# Patient Record
Sex: Male | Born: 1995 | Race: White | Hispanic: No | Marital: Single | Smoking: Never smoker
Health system: Southern US, Community
[De-identification: ages and names within clinical notes are randomized; demographics above are authoritative.]

## PROBLEM LIST (undated history)

## (undated) DIAGNOSIS — C8350 Lymphoblastic (diffuse) lymphoma, unspecified site: Secondary | ICD-10-CM

## (undated) HISTORY — PX: LUMBAR PUNCTURE: SHX1985

## (undated) HISTORY — PX: LYMPH NODE DISSECTION: SHX5087

---

## 2017-12-19 ENCOUNTER — Encounter: Payer: Self-pay | Admitting: Emergency Medicine

## 2017-12-19 ENCOUNTER — Other Ambulatory Visit: Payer: Self-pay

## 2017-12-19 ENCOUNTER — Emergency Department
Admission: EM | Admit: 2017-12-19 | Discharge: 2017-12-19 | Disposition: A | Discharge status: Home / Self Care | Payer: BLUE CROSS/BLUE SHIELD | Source: Home / Self Care | Attending: Emergency Medicine | Admitting: Emergency Medicine

## 2017-12-19 ENCOUNTER — Emergency Department: Payer: BLUE CROSS/BLUE SHIELD

## 2017-12-19 ENCOUNTER — Observation Stay
Admission: EM | Admit: 2017-12-19 | Discharge: 2017-12-21 | Disposition: A | Discharge status: Home / Self Care | Payer: BLUE CROSS/BLUE SHIELD | Attending: Internal Medicine | Admitting: Internal Medicine

## 2017-12-19 DIAGNOSIS — C9150 Adult T-cell lymphoma/leukemia (HTLV-1-associated) not having achieved remission: Secondary | ICD-10-CM | POA: Insufficient documentation

## 2017-12-19 DIAGNOSIS — R5081 Fever presenting with conditions classified elsewhere: Secondary | ICD-10-CM | POA: Insufficient documentation

## 2017-12-19 DIAGNOSIS — R740 Nonspecific elevation of levels of transaminase and lactic acid dehydrogenase [LDH]: Secondary | ICD-10-CM | POA: Insufficient documentation

## 2017-12-19 DIAGNOSIS — E860 Dehydration: Secondary | ICD-10-CM | POA: Insufficient documentation

## 2017-12-19 DIAGNOSIS — D709 Neutropenia, unspecified: Secondary | ICD-10-CM

## 2017-12-19 DIAGNOSIS — Z881 Allergy status to other antibiotic agents status: Secondary | ICD-10-CM | POA: Diagnosis not present

## 2017-12-19 DIAGNOSIS — R739 Hyperglycemia, unspecified: Secondary | ICD-10-CM | POA: Diagnosis not present

## 2017-12-19 DIAGNOSIS — D696 Thrombocytopenia, unspecified: Secondary | ICD-10-CM

## 2017-12-19 DIAGNOSIS — R509 Fever, unspecified: Secondary | ICD-10-CM | POA: Diagnosis present

## 2017-12-19 HISTORY — DX: Lymphoblastic (diffuse) lymphoma, unspecified site: C83.50

## 2017-12-19 LAB — BLOOD CULTURE ID PANEL (REFLEXED)
Acinetobacter baumannii: NOT DETECTED
CANDIDA ALBICANS: NOT DETECTED
CANDIDA GLABRATA: NOT DETECTED
CANDIDA KRUSEI: NOT DETECTED
CANDIDA TROPICALIS: NOT DETECTED
Candida parapsilosis: NOT DETECTED
ENTEROBACTER CLOACAE COMPLEX: NOT DETECTED
ENTEROBACTERIACEAE SPECIES: NOT DETECTED
ESCHERICHIA COLI: NOT DETECTED
Enterococcus species: NOT DETECTED
Haemophilus influenzae: NOT DETECTED
KLEBSIELLA OXYTOCA: NOT DETECTED
KLEBSIELLA PNEUMONIAE: NOT DETECTED
Listeria monocytogenes: NOT DETECTED
Methicillin resistance: NOT DETECTED
NEISSERIA MENINGITIDIS: NOT DETECTED
Proteus species: NOT DETECTED
Pseudomonas aeruginosa: NOT DETECTED
STREPTOCOCCUS PYOGENES: NOT DETECTED
STREPTOCOCCUS SPECIES: NOT DETECTED
Serratia marcescens: NOT DETECTED
Staphylococcus aureus (BCID): NOT DETECTED
Staphylococcus species: DETECTED — AB
Streptococcus agalactiae: NOT DETECTED
Streptococcus pneumoniae: NOT DETECTED

## 2017-12-19 LAB — COMPREHENSIVE METABOLIC PANEL
ALBUMIN: 4 g/dL (ref 3.5–5.0)
ALK PHOS: 59 U/L (ref 38–126)
ALT: 117 U/L — ABNORMAL HIGH (ref 0–44)
AST: 31 U/L (ref 15–41)
Anion gap: 6 (ref 5–15)
BILIRUBIN TOTAL: 0.7 mg/dL (ref 0.3–1.2)
BUN: 18 mg/dL (ref 6–20)
CALCIUM: 8.6 mg/dL — AB (ref 8.9–10.3)
CO2: 31 mmol/L (ref 22–32)
Chloride: 98 mmol/L (ref 98–111)
Creatinine, Ser: 0.75 mg/dL (ref 0.61–1.24)
GFR calc Af Amer: >60 mL/min (ref >60)
GLUCOSE: 112 mg/dL — AB (ref 70–99)
POTASSIUM: 3.6 mmol/L (ref 3.5–5.1)
Sodium: 135 mmol/L (ref 135–145)
TOTAL PROTEIN: 6 g/dL — AB (ref 6.5–8.1)

## 2017-12-19 LAB — MAGNESIUM: Magnesium: 1.9 mg/dL (ref 1.7–2.4)

## 2017-12-19 LAB — CBC WITH DIFFERENTIAL/PLATELET
BAND NEUTROPHILS: 6 %
BASOS ABS: 0 10*3/uL (ref 0–0.1)
Basophils Relative: 0 %
Blasts: 0 %
EOS ABS: 0.1 10*3/uL (ref 0–0.7)
Eosinophils Relative: 9 %
HCT: 31.6 % — ABNORMAL LOW (ref 40.0–52.0)
HEMOGLOBIN: 11.2 g/dL — AB (ref 13.0–18.0)
LYMPHS PCT: 21 %
Lymphs Abs: 0.3 10*3/uL — ABNORMAL LOW (ref 1.0–3.6)
MCH: 33.5 pg (ref 26.0–34.0)
MCHC: 35.5 g/dL (ref 32.0–36.0)
MCV: 94.3 fL (ref 80.0–100.0)
METAMYELOCYTES PCT: 0 %
MONO ABS: 0.1 10*3/uL — AB (ref 0.2–1.0)
MYELOCYTES: 0 %
Monocytes Relative: 7 %
Neutro Abs: 0.9 10*3/uL — ABNORMAL LOW (ref 1.4–6.5)
Neutrophils Relative %: 50 %
Other: 7 %
PROMYELOCYTES RELATIVE: 0 %
Platelets: 19 10*3/uL — CL (ref 150–440)
RBC: 3.35 MIL/uL — ABNORMAL LOW (ref 4.40–5.90)
RDW: 15.2 % — ABNORMAL HIGH (ref 11.5–14.5)
WBC: 1.6 10*3/uL — ABNORMAL LOW (ref 3.8–10.6)
nRBC: 0 /100 WBC

## 2017-12-19 LAB — LACTATE DEHYDROGENASE: LDH: 124 U/L (ref 98–192)

## 2017-12-19 LAB — PROCALCITONIN

## 2017-12-19 LAB — LACTIC ACID, PLASMA: Lactic Acid, Venous: 1.3 mmol/L (ref 0.5–1.9)

## 2017-12-19 LAB — PHOSPHORUS: PHOSPHORUS: 4.4 mg/dL (ref 2.5–4.6)

## 2017-12-19 LAB — URIC ACID: URIC ACID, SERUM: 6.7 mg/dL (ref 3.7–8.6)

## 2017-12-19 MED ORDER — SODIUM CHLORIDE 0.9 % IV BOLUS: 1000.0000 mL | Freq: Once | INTRAVENOUS | Status: DC

## 2017-12-19 MED ORDER — IBUPROFEN 600 MG PO TABS
600.0000 mg | ORAL_TABLET | Freq: Once | ORAL | Status: AC
  Administered 2017-12-19: 600 mg via ORAL
  Filled 2017-12-19: qty 1

## 2017-12-19 MED ORDER — SODIUM CHLORIDE 0.9 % IV BOLUS
1000.0000 mL | Freq: Once | INTRAVENOUS | Status: AC
  Administered 2017-12-19: 1000 mL via INTRAVENOUS

## 2017-12-19 MED ORDER — ACETAMINOPHEN 500 MG PO TABS
1000.0000 mg | ORAL_TABLET | Freq: Once | ORAL | Status: AC
  Administered 2017-12-19: 1000 mg via ORAL
  Filled 2017-12-19: qty 2

## 2017-12-19 MED ORDER — SODIUM CHLORIDE 0.9 % IV SOLN: 2.0000 g | Freq: Three times a day (TID) | INTRAVENOUS | Status: DC

## 2017-12-19 MED ORDER — ACETAMINOPHEN 500 MG PO TABS: 1000.0000 mg | ORAL_TABLET | Freq: Once | ORAL | Status: DC

## 2017-12-19 MED ORDER — SODIUM CHLORIDE 0.9 % IV SOLN
1.0000 g | Freq: Once | INTRAVENOUS | Status: AC
  Administered 2017-12-19: 1 g via INTRAVENOUS
  Filled 2017-12-19: qty 10

## 2017-12-19 NOTE — ED Provider Notes (Addendum)
[facility] Emergency Department Provider Note  ____________________________________________   First MD Initiated Contact with Patient 12/19/17 [phone]     (approximate)  I have reviewed the triage vital signs and the nursing notes.   HISTORY  Chief Complaint Fever   HPI Paul Knox is a 22 y.o. male who self presents to the emergency department with fever to 102 degrees today along with headache and nausea.  The patient has a past medical history of stage IV CLL and is roughly 24 months into a 36-month treatment.  That he [place] his [place] out of [place] however has recently returned to [place] to continue college.  He had his last treatment 1 week ago.  At that time she was neutropenic so some of his chemotherapy was deferred.  He has not had a fever in some time and his oncologist advised him to come to the closest emergency department.  He denies cough or shortness of breath.  He denies leg swelling.  He denies dysuria.  He denies sore throat, rash, or ear pain.  His fever came on suddenly and nothing seems to make it better or worse.  He declines medication for headaches.    Past Medical History:  Diagnosis Date  . 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 Neutropenic fever ([place]) 12/19/2017    History reviewed. No pertinent surgical history.  Prior to Admission medications   Medication Sig Start Date End Date Taking? Authorizing Provider  amphetamine-dextroamphetamine (ADDERALL XR) 10 MG 24 hr capsule Take 1 capsule by mouth daily. 12/07/17  Yes [provider]  LORazepam (ATIVAN) 1 MG tablet Take 1 mg by mouth every 6 (six) hours as needed for anxiety.   Yes [provider]  mercaptopurine (PURINETHOL) 50 MG tablet Take 3 tablets by mouth daily. 12/07/17  Yes [provider]  methotrexate (RHEUMATREX) 2.5 MG tablet Take 14 tablets by mouth every 7 (seven) days. 12/07/17  Yes [provider]  ondansetron (ZOFRAN-ODT) 8 MG disintegrating tablet Take 8 mg by mouth every 8 (eight) hours as needed for nausea or vomiting.   Yes [provider]  ranitidine (ZANTAC) 150 MG tablet Take 1 tablet by mouth 2 (two) times daily. 12/14/17  Yes [provider]  dexamethasone (DECADRON) 1 MG tablet TAKE 6 TABS BY MOUTH TWICE A DAY FOR 5 DAYS 12/11/17   [provider]  sulfamethoxazole-trimethoprim (BACTRIM DS,SEPTRA DS) 800-160 MG tablet Take 1 tablet by mouth 2 (two) times daily. 12/07/17   [provider]    Allergies Ciprofloxacin  History reviewed. No pertinent family history.  Social History Social History   Tobacco Use  . Smoking status: Never Smoker  . Smokeless tobacco: Never Used  Substance Use Topics  . Alcohol use: Not on file  . Drug use: Not on file    Review of Systems Constitutional: Positive for fevers Eyes: No visual changes. ENT: No sore throat. Cardiovascular: Denies chest pain. Respiratory: Denies shortness of breath. Gastrointestinal: No abdominal pain.  Positive for nausea nausea, no vomiting.  No diarrhea.  No constipation. Genitourinary: Negative for dysuria. Musculoskeletal: Negative for back pain. Skin: Negative for rash. Neurological: Positive for headache  ____________________________________________   PHYSICAL EXAM:  VITAL SIGNS: ED Triage Vitals  Enc Vitals Group     BP 12/19/17 0224 118/64     Pulse Rate 12/19/17 0224 (!) 113     Resp 12/19/17 0224 19  Temp 12/19/17 0224 (!) 101.6 F (38.7 C)     Temp Source 12/19/17 0224 Oral     SpO2 12/19/17 0224 97 %     Weight 12/19/17 0222 162 lb (73.5 kg)     Height 12/19/17 0222 6\' 2"  (1.88 m)     Head Circumference --      Peak Flow --      Pain Score 12/19/17 0222 6     Pain Loc --      Pain Edu? --      Excl. in [place]? --     Constitutional: Alert and oriented x4 nontoxic mild diaphoresis although no respiratory distress Eyes: PERRL EOMI.  midrange and brisk Head: Atraumatic.  Normal tympanic membranes bilaterally Nose: No congestion/rhinnorhea. Mouth/Throat: No trismus uvula midline no pharyngeal erythema or exudate Neck: No stridor.   Cardiovascular: Tachycardic rate, regular rhythm. Grossly normal heart sounds.  Good peripheral circulation. Respiratory: Normal respiratory effort.  No retractions. Lungs CTAB and moving good air Gastrointestinal: Soft nontender Musculoskeletal: No lower extremity edema   Neurologic:  Normal speech and language. No gross focal neurologic deficits are appreciated. Skin:  Skin is warm, dry and intact. No rash noted. Psychiatric: Mood and affect are normal. Speech and behavior are normal.    ____________________________________________   DIFFERENTIAL includes but not limited to  Febrile neutropenia, bacteremia, sepsis, viral infection ____________________________________________   LABS (all labs ordered are listed, but only abnormal results are displayed)  Labs Reviewed  COMPREHENSIVE METABOLIC PANEL - Abnormal; Notable for the following components:      Result Value   Glucose, Bld 112 (*)    Calcium 8.6 (*)    Total Protein 6.0 (*)    ALT 117 (*)    All other components within normal limits  CBC WITH DIFFERENTIAL/PLATELET - Abnormal; Notable for the following components:   WBC 1.6 (*)    RBC 3.35 (*)    Hemoglobin 11.2 (*)    HCT 31.6 (*)    RDW 15.2 (*)    Platelets 19 (*)    Neutro Abs 0.9 (*)    Lymphs Abs 0.3 (*)    Monocytes Absolute 0.1 (*)    All other components within normal limits  CULTURE, BLOOD (ROUTINE X 2)  CULTURE, BLOOD (ROUTINE X 2)  RESPIRATORY PANEL BY PCR  PROCALCITONIN  LACTIC ACID, PLASMA  LACTATE DEHYDROGENASE  MAGNESIUM  PHOSPHORUS  URIC ACID  LACTIC ACID, PLASMA  URINALYSIS, COMPLETE (UACMP) WITH MICROSCOPIC  FUNGITELL, SERUM    Lab work reviewed by me shows ANC of 800.  Significantly thrombocytopenic although according to his oncologist  this is at his baseline __________________________________________  EKG   ____________________________________________  RADIOLOGY  Chest x-ray reviewed by me with no acute disease noted ____________________________________________   PROCEDURES  Procedure(s) performed: no  .Critical Care Performed by: Darel Hong, MD Authorized by: Darel Hong, MD   Critical care provider statement:    Critical care time (minutes):  30   Critical care was necessary to treat or prevent imminent or life-threatening deterioration of the following conditions:  Sepsis   Critical care was time spent personally by me on the following activities:  Discussions with consultants, evaluation of patient's response to treatment, examination of patient, ordering and performing treatments and interventions, ordering and review of laboratory studies, ordering and review of radiographic studies, pulse oximetry, re-evaluation of patient's condition, obtaining history from patient or surrogate and review of old charts    Critical Care performed: Yes  ____________________________________________  INITIAL IMPRESSION / ASSESSMENT AND PLAN / ED COURSE  Pertinent labs & imaging results that were available during my care of the patient were reviewed by me and considered in my medical decision making (see chart for details).   As part of my medical decision making, I reviewed the following data within the [place] History obtained from family if available, nursing notes, old chart and ekg, as well as notes from prior ED visits.  The patient is very well informed about his disease and has asked me to reach out to his oncologist to discuss.  We will begin with labs and cultures now.     ----------------------------------------- 2:57 AM on 12/19/2017 ----------------------------------------- I spoke with the patient's oncologist Dr. Luetta Nutting who recommends ceftriaxone for now, not cefepime  and not vancomycin.  She is asking me to call her back with the results and we can discuss.  ____________________________________________ ----------------------------------------- 4:36 AM on 12/19/2017 -----------------------------------------  I spoke with the patient's oncologist Dr. Elta Guadeloupe [phone] who feels it is actually appropriate for the patient to be discharged home today with no more antibiotics.  She will personally call him later today to follow-up and ensure that he is improving.  I have reviewed all of his lab work with her and she has recommended no further antibiotics as the ceftriaxone will cover him for 24 hours.  The patient also gave me permission to discuss with his father who is out of state and is a Education officer, environmental and once again we went over all the patient's blood work and agree with the outpatient plan.  The patient understands he is more than welcome to return to our hospital at any point and we are happy to continue his care.  FINAL CLINICAL IMPRESSION(S) / ED DIAGNOSES  Final diagnoses:  Neutropenic fever ([place])  Thrombocytopenia ([place])      NEW MEDICATIONS STARTED DURING THIS VISIT:  New Prescriptions   No medications on file     Note:  This document was prepared using Dragon voice recognition software and may include unintentional dictation errors.     Darel Hong, MD 12/19/17 8295    Darel Hong, MD 12/19/17 [phone]

## 2017-12-19 NOTE — ED Triage Notes (Signed)
Patient ambulatory to triage with steady gait, without difficulty or distress noted; pt here last night for fever, currently receiving chemo; returns tonight; st was called back for a positive blood culture

## 2017-12-19 NOTE — ED Notes (Signed)
Pt refusal to wear 5 leads, pulse ox, bp cuff. Requesting d/c. EDP aware.

## 2017-12-19 NOTE — ED Triage Notes (Addendum)
Patient ambulatory to triage with steady gait, without difficulty or distress noted; pt currently receiving chemo; fever 100.5-102 today accomp by nausea and HA to temples; mask placed on pt

## 2017-12-19 NOTE — ED Provider Notes (Addendum)
Patient's blood culture returned with positive staph infection  I have called and spoke with the patient himself via the phone.  He reports he is feeling all right at the moment, discussed his culture and he will be returning to the ER.  Patient understands he is showing evidence of a bloodstream infection that needs IV antibiotics and evaluation at the hospital tonight/as soon as possible this evaning.  Patient reports that he will be coming over to the ER shortly this evening for further reevaluation.  I did convey the urgency of needing to return to the ER, patient reports that at the moment he is doing well and will be able to be over here shortly this evening.   Delman Kitten, MD 12/19/17 2054   Father advises patient has chronic R great toe infection is a possible source and also had a recent fall with some abrasions.   Father advises son will be coming to [facility] ER for evaluation.     Delman Kitten, MD 12/19/17 2157

## 2017-12-19 NOTE — ED Notes (Signed)
Pt refused to be admitted to hospital. Dr. Mable Paris worked out an out of hospital care plan with his PCP and is discharging pt.

## 2017-12-19 NOTE — ED Notes (Signed)
Lab called with positive blood culture of staphyloccus  Dr Jacqualine Code aware.

## 2017-12-19 NOTE — Progress Notes (Signed)
CODE SEPSIS - PHARMACY COMMUNICATION  **Broad Spectrum Antibiotics should be administered within 1 hour of Sepsis diagnosis**  Time Code Sepsis Called/Page Received: 2902 1115  Antibiotics Ordered: 5208 0223  Time of 1st antibiotic administration: 3612 2449  Additional action taken by pharmacy:   If necessary, Name of Provider/Nurse Contacted:     Eloise Harman ,PharmD Clinical Pharmacist  12/19/2017  3:49 AM

## 2017-12-19 NOTE — Discharge Instructions (Addendum)
Please make sure you keep your phone on at all times today because Dr. Elta Guadeloupe will be calling you in a few hours for a recheck.  Please to return to the emergency department at any point for worsening pain, worsening fevers, if you cannot eat or drink, or for any other issues whatsoever.  It was a pleasure to take care of you today, and thank you for coming to our emergency department.  If you have any questions or concerns before leaving please ask the nurse to grab me and I'm more than happy to go through your aftercare instructions again.  If you were prescribed any opioid pain medication today such as Norco, Vicodin, Percocet, morphine, hydrocodone, or oxycodone please make sure you do not drive when you are taking this medication as it can alter your ability to drive safely.  If you have any concerns once you are home that you are not improving or are in fact getting worse before you can make it to your follow-up appointment, please do not hesitate to call 911 and come back for further evaluation.  Darel Hong, MD  Results for orders placed or performed during the hospital encounter of 12/19/17  Comprehensive metabolic panel  Result Value Ref Range   Sodium 135 135 - 145 mmol/L   Potassium 3.6 3.5 - 5.1 mmol/L   Chloride 98 98 - 111 mmol/L   CO2 31 22 - 32 mmol/L   Glucose, Bld 112 (H) 70 - 99 mg/dL   BUN 18 6 - 20 mg/dL   Creatinine, Ser 0.75 0.61 - 1.24 mg/dL   Calcium 8.6 (L) 8.9 - 10.3 mg/dL   Total Protein 6.0 (L) 6.5 - 8.1 g/dL   Albumin 4.0 3.5 - 5.0 g/dL   AST 31 15 - 41 U/L   ALT 117 (H) 0 - 44 U/L   Alkaline Phosphatase 59 38 - 126 U/L   Total Bilirubin 0.7 0.3 - 1.2 mg/dL   GFR calc non Af Amer >60 >60 mL/min   GFR calc Af Amer >60 >60 mL/min   Anion gap 6 5 - 15  Procalcitonin  Result Value Ref Range   Procalcitonin <0.10 ng/mL  CBC WITH DIFFERENTIAL  Result Value Ref Range   WBC PENDING 4.0 - 10.5 K/uL   RBC 3.35 (L) 4.40 - 5.90 MIL/uL   Hemoglobin 11.2 (L)  13.0 - 18.0 g/dL   HCT 31.6 (L) 40.0 - 52.0 %   MCV 94.3 80.0 - 100.0 fL   MCH 33.5 26.0 - 34.0 pg   MCHC 35.5 32.0 - 36.0 g/dL   RDW 15.2 (H) 11.5 - 14.5 %   Platelets 19 (LL) 150 - 440 K/uL   Neutrophils Relative % PENDING %   Neutro Abs PENDING 1.7 - 7.7 K/uL   Band Neutrophils PENDING %   Lymphocytes Relative PENDING %   Lymphs Abs PENDING 0.7 - 4.0 K/uL   Monocytes Relative PENDING %   Monocytes Absolute PENDING 0.1 - 1.0 K/uL   Eosinophils Relative PENDING %   Eosinophils Absolute PENDING 0.0 - 0.7 K/uL   Basophils Relative PENDING %   Basophils Absolute PENDING 0.0 - 0.1 K/uL   WBC Morphology PENDING    RBC Morphology PENDING    Smear Review PENDING    Other PENDING %   nRBC PENDING 0 /100 WBC   Metamyelocytes Relative PENDING %   Myelocytes PENDING %   Promyelocytes Relative PENDING %   Blasts PENDING %  Lactic acid, plasma  Result  Value Ref Range   Lactic Acid, Venous 1.3 0.5 - 1.9 mmol/L  Lactate dehydrogenase  Result Value Ref Range   LDH 124 98 - 192 U/L  Magnesium  Result Value Ref Range   Magnesium 1.9 1.7 - 2.4 mg/dL  Phosphorus  Result Value Ref Range   Phosphorus 4.4 2.5 - 4.6 mg/dL  Uric acid  Result Value Ref Range   Uric Acid, Serum 6.7 3.7 - 8.6 mg/dL   Dg Chest Port 1 View  Result Date: 12/19/2017 CLINICAL DATA:  Febrile neutropenia EXAM: PORTABLE CHEST 1 VIEW COMPARISON:  None. FINDINGS: The heart size and mediastinal contours are within normal limits. Both lungs are clear. The visualized skeletal structures are unremarkable. IMPRESSION: No active disease. Electronically Signed   By: Ashley Royalty M.D.   On: 12/19/2017 03:50

## 2017-12-20 ENCOUNTER — Other Ambulatory Visit: Payer: Self-pay

## 2017-12-20 ENCOUNTER — Emergency Department: Payer: BLUE CROSS/BLUE SHIELD

## 2017-12-20 ENCOUNTER — Encounter: Payer: Self-pay | Admitting: Internal Medicine

## 2017-12-20 DIAGNOSIS — D709 Neutropenia, unspecified: Principal | ICD-10-CM

## 2017-12-20 DIAGNOSIS — R5081 Fever presenting with conditions classified elsewhere: Secondary | ICD-10-CM | POA: Diagnosis not present

## 2017-12-20 DIAGNOSIS — C91Z0 Other lymphoid leukemia not having achieved remission: Secondary | ICD-10-CM

## 2017-12-20 LAB — CBC WITH DIFFERENTIAL/PLATELET
BASOS ABS: 0 10*3/uL (ref 0–0.1)
BASOS PCT: 0 %
Band Neutrophils: 1 %
Blasts: 0 %
EOS PCT: 5 %
Eosinophils Absolute: 0.1 10*3/uL (ref 0–0.7)
HCT: 33.8 % — ABNORMAL LOW (ref 40.0–52.0)
Hemoglobin: 12.1 g/dL — ABNORMAL LOW (ref 13.0–18.0)
LYMPHS ABS: 0.6 10*3/uL — AB (ref 1.0–3.6)
Lymphocytes Relative: 32 %
MCH: 33.6 pg (ref 26.0–34.0)
MCHC: 35.9 g/dL (ref 32.0–36.0)
MCV: 93.7 fL (ref 80.0–100.0)
METAMYELOCYTES PCT: 1 %
MONO ABS: 0.2 10*3/uL (ref 0.2–1.0)
MONOS PCT: 13 %
MYELOCYTES: 0 %
NEUTROS ABS: 0.8 10*3/uL — AB (ref 1.4–6.5)
Neutrophils Relative %: 42 %
Other: 6 %
PLATELETS: 20 10*3/uL — AB (ref 150–440)
Promyelocytes Relative: 0 %
RBC: 3.6 MIL/uL — AB (ref 4.40–5.90)
RDW: 14.7 % — AB (ref 11.5–14.5)
WBC: 1.9 10*3/uL — AB (ref 3.8–10.6)
nRBC: 1 /100 WBC — ABNORMAL HIGH

## 2017-12-20 LAB — URINALYSIS, ROUTINE W REFLEX MICROSCOPIC
Bilirubin Urine: NEGATIVE
Glucose, UA: NEGATIVE mg/dL
Hgb urine dipstick: NEGATIVE
KETONES UR: NEGATIVE mg/dL
Leukocytes, UA: NEGATIVE
Nitrite: NEGATIVE
PROTEIN: NEGATIVE mg/dL
Specific Gravity, Urine: 1.008 (ref 1.005–1.030)
pH: 8 (ref 5.0–8.0)

## 2017-12-20 LAB — RESPIRATORY PANEL BY PCR
ADENOVIRUS-RVPPCR: NOT DETECTED
Bordetella pertussis: NOT DETECTED
CORONAVIRUS HKU1-RVPPCR: NOT DETECTED
CORONAVIRUS NL63-RVPPCR: NOT DETECTED
CORONAVIRUS OC43-RVPPCR: NOT DETECTED
Chlamydophila pneumoniae: NOT DETECTED
Coronavirus 229E: NOT DETECTED
Influenza A: NOT DETECTED
Influenza B: NOT DETECTED
MYCOPLASMA PNEUMONIAE-RVPPCR: NOT DETECTED
Metapneumovirus: NOT DETECTED
PARAINFLUENZA VIRUS 1-RVPPCR: NOT DETECTED
PARAINFLUENZA VIRUS 3-RVPPCR: NOT DETECTED
PARAINFLUENZA VIRUS 4-RVPPCR: NOT DETECTED
Parainfluenza Virus 2: NOT DETECTED
Respiratory Syncytial Virus: NOT DETECTED
Rhinovirus / Enterovirus: DETECTED — AB

## 2017-12-20 LAB — COMPREHENSIVE METABOLIC PANEL
ALBUMIN: 4.1 g/dL (ref 3.5–5.0)
ALT: 101 U/L — ABNORMAL HIGH (ref 0–44)
AST: 24 U/L (ref 15–41)
Alkaline Phosphatase: 64 U/L (ref 38–126)
Anion gap: 9 (ref 5–15)
BUN: 16 mg/dL (ref 6–20)
CHLORIDE: 98 mmol/L (ref 98–111)
CO2: 28 mmol/L (ref 22–32)
Calcium: 8.4 mg/dL — ABNORMAL LOW (ref 8.9–10.3)
Creatinine, Ser: 0.7 mg/dL (ref 0.61–1.24)
GFR calc Af Amer: >60 mL/min (ref >60)
GFR calc non Af Amer: >60 mL/min (ref >60)
GLUCOSE: 109 mg/dL — AB (ref 70–99)
Potassium: 3.8 mmol/L (ref 3.5–5.1)
SODIUM: 135 mmol/L (ref 135–145)
Total Bilirubin: 0.9 mg/dL (ref 0.3–1.2)
Total Protein: 6.2 g/dL — ABNORMAL LOW (ref 6.5–8.1)

## 2017-12-20 LAB — CBC
HEMATOCRIT: 30.3 % — AB (ref 40.0–52.0)
Hemoglobin: 11 g/dL — ABNORMAL LOW (ref 13.0–18.0)
MCH: 33.7 pg (ref 26.0–34.0)
MCHC: 36.1 g/dL — AB (ref 32.0–36.0)
MCV: 93.4 fL (ref 80.0–100.0)
Platelets: 16 10*3/uL — CL (ref 150–440)
RBC: 3.25 MIL/uL — ABNORMAL LOW (ref 4.40–5.90)
RDW: 14.8 % — AB (ref 11.5–14.5)
WBC: 1.8 10*3/uL — AB (ref 3.8–10.6)

## 2017-12-20 LAB — BASIC METABOLIC PANEL
Anion gap: 5 (ref 5–15)
BUN: 12 mg/dL (ref 6–20)
CO2: 29 mmol/L (ref 22–32)
Calcium: 8.3 mg/dL — ABNORMAL LOW (ref 8.9–10.3)
Chloride: 104 mmol/L (ref 98–111)
Creatinine, Ser: 0.6 mg/dL — ABNORMAL LOW (ref 0.61–1.24)
GFR calc Af Amer: >60 mL/min (ref >60)
GFR calc non Af Amer: >60 mL/min (ref >60)
GLUCOSE: 102 mg/dL — AB (ref 70–99)
POTASSIUM: 4.1 mmol/L (ref 3.5–5.1)
Sodium: 138 mmol/L (ref 135–145)

## 2017-12-20 LAB — LACTIC ACID, PLASMA: LACTIC ACID, VENOUS: 1 mmol/L (ref 0.5–1.9)

## 2017-12-20 MED ORDER — LACTATED RINGERS IV SOLN
INTRAVENOUS | Status: DC
  Administered 2017-12-20 – 2017-12-21 (×3): via INTRAVENOUS

## 2017-12-20 MED ORDER — ONDANSETRON HCL 4 MG PO TABS: 4.0000 mg | ORAL_TABLET | Freq: Four times a day (QID) | ORAL | Status: DC | PRN

## 2017-12-20 MED ORDER — SODIUM CHLORIDE 0.9 % IV BOLUS
1000.0000 mL | Freq: Once | INTRAVENOUS | Status: AC
  Administered 2017-12-20: 1000 mL via INTRAVENOUS

## 2017-12-20 MED ORDER — VANCOMYCIN HCL IN DEXTROSE 1-5 GM/200ML-% IV SOLN: 1000.0000 mg | Freq: Two times a day (BID) | INTRAVENOUS | Status: DC

## 2017-12-20 MED ORDER — VANCOMYCIN HCL IN DEXTROSE 1-5 GM/200ML-% IV SOLN
INTRAVENOUS | Status: AC
  Administered 2017-12-20: 1000 mg via INTRAVENOUS
  Filled 2017-12-20: qty 200

## 2017-12-20 MED ORDER — FAMOTIDINE 20 MG PO TABS: 20.0000 mg | ORAL_TABLET | Freq: Two times a day (BID) | ORAL | Status: DC | PRN

## 2017-12-20 MED ORDER — ONDANSETRON HCL 4 MG/2ML IJ SOLN: 4.0000 mg | Freq: Four times a day (QID) | INTRAMUSCULAR | Status: DC | PRN

## 2017-12-20 MED ORDER — SENNOSIDES-DOCUSATE SODIUM 8.6-50 MG PO TABS: 1.0000 | ORAL_TABLET | Freq: Every evening | ORAL | Status: DC | PRN

## 2017-12-20 MED ORDER — SODIUM CHLORIDE 0.9 % IV SOLN
1.0000 g | Freq: Once | INTRAVENOUS | Status: AC
  Administered 2017-12-20: 1 g via INTRAVENOUS
  Filled 2017-12-20: qty 10

## 2017-12-20 MED ORDER — BISACODYL 5 MG PO TBEC: 5.0000 mg | DELAYED_RELEASE_TABLET | Freq: Every day | ORAL | Status: DC | PRN

## 2017-12-20 MED ORDER — PIPERACILLIN-TAZOBACTAM 3.375 G IVPB
3.3750 g | Freq: Three times a day (TID) | INTRAVENOUS | Status: DC
  Administered 2017-12-20 – 2017-12-21 (×4): 3.375 g via INTRAVENOUS
  Filled 2017-12-20 (×3): qty 50

## 2017-12-20 MED ORDER — ACETAMINOPHEN 325 MG PO TABS
650.0000 mg | ORAL_TABLET | Freq: Four times a day (QID) | ORAL | Status: DC | PRN
  Administered 2017-12-20 (×2): 650 mg via ORAL
  Filled 2017-12-20 (×2): qty 2

## 2017-12-20 MED ORDER — VANCOMYCIN HCL IN DEXTROSE 1-5 GM/200ML-% IV SOLN
1000.0000 mg | Freq: Once | INTRAVENOUS | Status: AC
  Administered 2017-12-20: 1000 mg via INTRAVENOUS

## 2017-12-20 MED ORDER — ACETAMINOPHEN 650 MG RE SUPP: 650.0000 mg | Freq: Four times a day (QID) | RECTAL | Status: DC | PRN

## 2017-12-20 MED ORDER — VANCOMYCIN HCL IN DEXTROSE 1-5 GM/200ML-% IV SOLN
1000.0000 mg | Freq: Three times a day (TID) | INTRAVENOUS | Status: DC
  Administered 2017-12-20: 1000 mg via INTRAVENOUS
  Filled 2017-12-20 (×3): qty 200

## 2017-12-20 MED ORDER — LORAZEPAM 1 MG PO TABS
1.0000 mg | ORAL_TABLET | Freq: Four times a day (QID) | ORAL | Status: DC | PRN
  Administered 2017-12-20 (×3): 1 mg via ORAL
  Filled 2017-12-20 (×3): qty 1

## 2017-12-20 MED ORDER — PIPERACILLIN-TAZOBACTAM 3.375 G IVPB
INTRAVENOUS | Status: AC
  Filled 2017-12-20: qty 50

## 2017-12-20 NOTE — Consult Note (Signed)
[facility] CONSULT NOTE  Patient Care Team: System, Pcp Not In as PCP - General  CHIEF COMPLAINTS/PURPOSE OF CONSULTATION:  Neutropenic fever  HISTORY OF PRESENTING ILLNESS:  Paul Knox 22 y.o.  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  Patient states that he also diagnosed with T-cell lymphoma/leukemia approximately 2 and half years ago for which has been treated at [facility].  Patient is currently on maintenance therapy with vincristine/6-MP; and approximately every 3 months LP/intrathecal chemotherapy.  Patient states that his chemotherapy drugs titrated based upon his blood counts.  As per the patient he has been doing well from the lymphoma/leukemia standpoint.  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  Usually has his blood counts checked on every two-week basis.  ROS: A complete 10 point review of system is done which is negative except mentioned above in history of present illness  MEDICAL HISTORY:  Past Medical History:  Diagnosis Date  . Lymphoblastic lymphoma ([place])     SURGICAL HISTORY: History reviewed. No pertinent surgical history.  SOCIAL HISTORY: Social History   Socioeconomic History  . Marital status: Single    Spouse name: Not on file  . Number of children: Not on file  . Years of education: Not on file  . Highest education level: Not on file  Occupational History  . Not on file  Social Needs  . Financial resource strain: Not on file  . Food insecurity:    Worry: Not on file    Inability: Not on file  . Transportation needs:    Medical: Not on file    Non-medical: Not on file  Tobacco Use  . Smoking status: Never Smoker  . Smokeless tobacco: Never Used  Substance and Sexual Activity  . Alcohol use: Not on file  . Drug use: Not on file  . Sexual activity: Not on file   Lifestyle  . Physical activity:    Days per week: Not on file    Minutes per session: Not on file  . Stress: Not on file  Relationships  . Social connections:    Talks on phone: Not on file    Gets together: Not on file    Attends religious service: Not on file    Active member of club or organization: Not on file    Attends meetings of clubs or organizations: Not on file    Relationship status: Not on file  . Intimate partner violence:    Fear of current or ex partner: Not on file    Emotionally abused: Not on file    Physically abused: Not on file    Forced sexual activity: Not on file  Other Topics Concern  . Not on file  Social History Narrative  . Not on file    FAMILY HISTORY: History reviewed. No pertinent family history.  ALLERGIES:  is allergic to ciprofloxacin.  MEDICATIONS:  Current Facility-Administered Medications  Medication Dose Route Frequency Provider Last Rate Last Dose  . acetaminophen (TYLENOL) tablet 650 mg  650 mg Oral Q6H PRN Arta Silence, MD   650 mg at 12/20/17 0407   Or  . acetaminophen (TYLENOL) suppository 650 mg  650 mg Rectal Q6H PRN Arta Silence, MD      . bisacodyl (DULCOLAX) EC tablet 5 mg  5 mg Oral Daily PRN Arta Silence, MD      .  famotidine (PEPCID) tablet 20 mg  20 mg Oral BID PRN Arta Silence, MD      . lactated ringers infusion   Intravenous Continuous Arta Silence, MD 75 mL/hr at 12/20/17 1656    . LORazepam (ATIVAN) tablet 1 mg  1 mg Oral Q6H PRN Arta Silence, MD   1 mg at 12/20/17 1119  . ondansetron (ZOFRAN) tablet 4 mg  4 mg Oral Q6H PRN Arta Silence, MD       Or  . ondansetron (ZOFRAN) injection 4 mg  4 mg Intravenous Q6H PRN Arta Silence, MD      . piperacillin-tazobactam (ZOSYN) IVPB 3.375 g  3.375 g Intravenous Q8H Arta Silence, MD 12.5 mL/hr at 12/20/17 1841 3.375 g at 12/20/17 1841  . senna-docusate (Senokot-S) tablet 1 tablet  1 tablet Oral QHS PRN Arta Silence, MD          .  PHYSICAL EXAMINATION:  Vitals:   12/20/17 0747 12/20/17 1045  BP: (!) 113/46 123/68  Pulse: 66 87  Resp:    Temp: 98.4 F (36.9 C) 98.6 F (37 C)  SpO2: 98% 100%   Filed Weights   12/19/17 2351 12/20/17 0336  Weight: 162 lb 0.6 oz (73.5 kg) 169 lb 9.6 oz (76.9 kg)    GENERAL: Well-nourished well-developed; Alert, no distress and comfortable.   Nontoxic. EYES: no pallor or icterus OROPHARYNX: no thrush or ulceration. NECK: supple, no masses felt LYMPH:  no palpable lymphadenopathy in the cervical, axillary or inguinal regions LUNGS: Normal breath sounds to auscultation at bases and  No wheeze or crackles HEART/CVS: regular rate & rhythm and no murmurs; No lower extremity edema ABDOMEN: abdomen soft, non-tender and normal bowel sounds Musculoskeletal:no cyanosis of digits and no clubbing  PSYCH: alert & oriented x 3 with fluent speech NEURO: no focal motor/sensory deficits SKIN:  no rashes or significant lesions  LABORATORY DATA:  I have reviewed the data as listed Lab Results  Component Value Date   WBC 1.8 (L) 12/20/2017   HGB 11.0 (L) 12/20/2017   HCT 30.3 (L) 12/20/2017   MCV 93.4 12/20/2017   PLT 16 (LL) 12/20/2017   Recent Labs    12/19/17 0302 12/20/17 0009 12/20/17 0758  NA 135 135 138  K 3.6 3.8 4.1  CL 98 98 104  CO2 31 28 29   GLUCOSE 112* 109* 102*  BUN 18 16 12   CREATININE 0.75 0.70 0.60*  CALCIUM 8.6* 8.4* 8.3*  GFRNONAA >60 >60 >60  GFRAA >60 >60 >60  PROT 6.0* 6.2*  --   ALBUMIN 4.0 4.1  --   AST 31 24  --   ALT 117* 101*  --   ALKPHOS 59 64  --   BILITOT 0.7 0.9  --     RADIOGRAPHIC STUDIES: I have personally reviewed the radiological images as listed and agreed with the findings in the report. Dg Chest Port 1 View  Result Date: 12/19/2017 CLINICAL DATA:  Febrile neutropenia EXAM: PORTABLE CHEST 1 VIEW COMPARISON:  None. FINDINGS: The heart size and mediastinal contours are within normal limits. Both lungs  are clear. The visualized skeletal structures are unremarkable. IMPRESSION: No active disease. Electronically Signed   By: Ashley Royalty M.D.   On: 12/19/2017 03:50   Dg Toe Great Right  Result Date: 12/20/2017 CLINICAL DATA:  Positive blood cultures with right great toe pain. Evaluate for osteomyelitis. EXAM: RIGHT GREAT TOE COMPARISON:  None. FINDINGS: No cortical bone destruction or fracture. No joint dislocation of the right  great toe. No marginal nor extra articular erosions. No periostitis. No soft tissue mass, emphysema or significant soft tissue swelling. IMPRESSION: Negative for acute fracture, joint dislocation or bone destruction. Electronically Signed   By: Ashley Royalty M.D.   On: 12/20/2017 00:33    ASSESSMENT & PLAN:   #22 year old male patient with T-cell lymphoma/leukemia currently to the hospital for neutropenic fever.  #Neutropenic fever-unclear etiology blood cultures pending. chest x-ray urinalysis negative.  Hold off Granix as patient does not appear toxic.  Currently on broad-spectrum antibiotics with vancomycin and Zosyn.  Await cultures; patient will be discharged home on Levaquin for total of 7 days.   #Acute T cell lymphoma/leukemia-currently on maintenance therapy; as per patient clinical remission.  He is to finish his maintenance therapy July 2020.   Disposition : patient feeling significantly better since admit to the hospital.  He is interested in being discharged in the morning tomorrow as he plans to class the morning.   # Patient has my information; he was encouraged to call us if any questions or concerns with regards to any acute issues while he is in town.  Thank you Dr.Modi for allowing me to participate in the care of your pleasant patient. Please do not hesitate to contact me with questions or concerns in the interim.  All questions were answered. The patient knows to call the clinic with any problems, questions or concerns.    Cammie Sickle,  MD 12/20/2017 8:10 PM

## 2017-12-20 NOTE — ED Notes (Signed)
Paient transported to room 118 by this EDT.

## 2017-12-20 NOTE — H&P (Addendum)
[place] [facility] [place] NAME: Paul Knox    MR#:  [id number]  DATE OF BIRTH:  1995/12/19  DATE OF ADMISSION:  12/19/2017  PRIMARY CARE PHYSICIAN: System, Pcp Not In   REQUESTING/REFERRING PHYSICIAN: Merlyn Lot, MD  CHIEF COMPLAINT:   Chief Complaint  Patient presents with  . Fever    HISTORY OF PRESENT ILLNESS:  Paul Knox  is a 22 y.o. male with a known history of T-cell lymphoblastic lymphoma who p/w neutropenic fever. Pt was at [facility] ED on the evening of Monday 12/18/2017 w/ neutropenic fever. The case was discussed w/ pt's Oncology group, who recommended a single dose of IV Ceftriaxone, and D/C home. Pt was agreeable to this, and went home. He now returns w/ continued fevers and a (+) blood culture result.  Paul Knox states that he receives care at [facility] in [place], [place]. He states his primary physician is Dr. Corinna Capra. He states he presently attends [facility], but one Monday per month, he drives back to [place] and manages his medical care. He states that he is in the maintenance phase of therapy for his T-cell lymphoma. My understanding is that he receives intrathecal Vincristine once per month, which I believe he received last Monday (09/09), and takes 6-MP, Methotrexate and Decadron as maintenance therapy. He states that the medications are generally held when his [place] is in the triple digits, and goal ANC is 1200-1800. It has been held recently for this reason. He states he typically has labwork drawn q2wks, but when hematological indices are abnormal, he has bloodwork performed q1wk instead (which he states is presently the case). He is already scheduled for labwork this coming Monday (09/23). We had a long conversation regarding fragmentation of care. He tells me that he has not had any significant issues necessitating hospitalization in the local area up until  yesterday.  Pt states that prior to coming to the ED on 09/16, he had bene experiencing fever, chills, diaphoresis and lightheadedness. He endorses an episode of loose stools, which he attributes to a dietary change, but denies frank diarrhea or N/V/AP. He states he came to the ED for evaluation, received a dose of IV Ceftriaxone and IVF. He states he felt much better, and went home. He continued to feel better at home. He states, however, that he did continue to have low-grade fevers. He endorses dehydration, but has otherwise not had any new symptoms. He states he slept for much of the day. Later in the day, he received a phone call from the lab stating that a blood culture result was positive, and that he should return to the ED. Culture result reads, "Methicillin (oxacillin) susceptible coagulase negative staphylococcus. Possible blood culture contaminant." Lactate 1.0. PCT yesterday was < 0.10. Bloodwork demonstrates pancytopenia, stable. Pt appears well, and does not appear toxic. He feels well. He denies cough/SOB, N/V/D/AP, urinary symptoms. He notes a past Hx of R great toe ingrown nail w/ infxn. The toe does not appear infected, and an XR is (-) for osteomyelitis. Pt states he hopes to go home later in the day on Wednesday 09/18.  PAST MEDICAL HISTORY:   Past Medical History:  Diagnosis Date  . Lymphoblastic lymphoma ([place])     PAST SURGICAL HISTORY:  History reviewed. No pertinent surgical history.  SOCIAL HISTORY:   Social History   Tobacco Use  . Smoking status: Never Smoker  . Smokeless tobacco: Never  Used  Substance Use Topics  . Alcohol use: Not on file    FAMILY HISTORY:  History reviewed. No pertinent family history.  DRUG ALLERGIES:   Allergies  Allergen Reactions  . Ciprofloxacin     REVIEW OF SYSTEMS:   Review of Systems  Constitutional: Positive for chills, diaphoresis and fever. Negative for malaise/fatigue and weight loss.  HENT: Negative for congestion,  ear pain, hearing loss, nosebleeds, sinus pain, sore throat and tinnitus.   Eyes: Negative for blurred vision, double vision and photophobia.  Respiratory: Negative for cough, hemoptysis, sputum production, shortness of breath and wheezing.   Cardiovascular: Negative for chest pain, palpitations, orthopnea, claudication, leg swelling and PND.  Gastrointestinal: Negative for abdominal pain, blood in stool, constipation, diarrhea, heartburn, melena, nausea and vomiting.  Genitourinary: Negative for dysuria, flank pain, frequency, hematuria and urgency.  Musculoskeletal: Negative for back pain, falls, joint pain, myalgias and neck pain.  Skin: Negative for itching and rash.  Neurological: Positive for dizziness and tremors. Negative for tingling, sensory change, speech change, focal weakness, seizures, loss of consciousness, weakness and headaches.  Psychiatric/Behavioral: Negative for depression and memory loss. The patient is nervous/anxious. The patient does not have insomnia.    MEDICATIONS AT HOME:   Prior to Admission medications   Medication Sig Start Date End Date Taking? Authorizing Provider  amphetamine-dextroamphetamine (ADDERALL XR) 10 MG 24 hr capsule Take 1 capsule by mouth daily. 12/07/17  Yes [provider]  LORazepam (ATIVAN) 1 MG tablet Take 1 mg by mouth every 6 (six) hours as needed for anxiety.   Yes [provider]  mercaptopurine (PURINETHOL) 50 MG tablet Take 3 tablets by mouth daily. 12/07/17  Yes [provider]  methotrexate (RHEUMATREX) 2.5 MG tablet Take 14 tablets by mouth every 7 (seven) days. 12/07/17  Yes [provider]  ondansetron (ZOFRAN-ODT) 8 MG disintegrating tablet Take 8 mg by mouth every 8 (eight) hours as needed for nausea or vomiting.   Yes [provider]  ranitidine (ZANTAC) 150 MG tablet Take 1 tablet by mouth 2 (two) times daily. 12/14/17  Yes [provider]  dexamethasone (DECADRON) 1 MG tablet TAKE 6  TABS BY MOUTH TWICE A DAY FOR 5 DAYS 12/11/17   [provider]  sulfamethoxazole-trimethoprim (BACTRIM DS,SEPTRA DS) 800-160 MG tablet Take 1 tablet by mouth 2 (two) times daily. 12/07/17   [provider]      VITAL SIGNS:  Blood pressure 125/86, pulse (!) 107, temperature 100 F (37.8 C), temperature source Oral, resp. rate 16, height 6\' 2"  (1.88 m), weight 73.5 kg, SpO2 97 %.  PHYSICAL EXAMINATION:  Physical Exam  Constitutional: He is oriented to person, place, and time. He appears well-developed and well-nourished. He is active and cooperative.  Non-toxic appearance. He does not have a sickly appearance. He does not appear ill. No distress. He is not intubated.  HENT:  Head: Normocephalic and atraumatic.  Mouth/Throat: Oropharynx is clear and moist. No oropharyngeal exudate.  Eyes: Conjunctivae, EOM and lids are normal. No scleral icterus.  Neck: Neck supple. No JVD present. No thyromegaly present.  Cardiovascular: Normal rate, regular rhythm, S1 normal, S2 normal and normal heart sounds.  No extrasystoles are present. Exam reveals no gallop, no S3, no S4, no distant heart sounds and no friction rub.  No murmur heard. Pulmonary/Chest: Effort normal and breath sounds normal. No accessory muscle usage or stridor. No apnea, no tachypnea and no bradypnea. He is not intubated. No respiratory distress. He has no decreased  breath sounds. He has no wheezes. He has no rhonchi. He has no rales.  Abdominal: Soft. Bowel sounds are normal. He exhibits no distension. There is no tenderness. There is no rigidity, no rebound and no guarding.  Musculoskeletal: Normal range of motion. He exhibits no edema or tenderness.  Lymphadenopathy:    He has no cervical adenopathy.  Neurological: He is alert and oriented to person, place, and time. He is not disoriented.  Skin: Skin is warm, dry and intact. No rash noted. He is not diaphoretic. No erythema. No pallor.  Psychiatric: He has a normal  mood and affect. His speech is normal and behavior is normal. Judgment and thought content normal. Cognition and memory are normal.   Non-tachycardic on exam. LABORATORY PANEL:   CBC Recent Labs  Lab 12/20/17 0009  WBC 1.9*  HGB 12.1*  HCT 33.8*  PLT 20*   ------------------------------------------------------------------------------------------------------------------  Chemistries  Recent Labs  Lab 12/19/17 0302 12/20/17 0009  NA 135 135  K 3.6 3.8  CL 98 98  CO2 31 28  GLUCOSE 112* 109*  BUN 18 16  CREATININE 0.75 0.70  CALCIUM 8.6* 8.4*  MG 1.9  --   AST 31 24  ALT 117* 101*  ALKPHOS 59 64  BILITOT 0.7 0.9   ------------------------------------------------------------------------------------------------------------------  Cardiac Enzymes No results for input(s): TROPONINI in the last 168 hours. ------------------------------------------------------------------------------------------------------------------  RADIOLOGY:  Dg Chest Port 1 View  Result Date: 12/19/2017 CLINICAL DATA:  Febrile neutropenia EXAM: PORTABLE CHEST 1 VIEW COMPARISON:  None. FINDINGS: The heart size and mediastinal contours are within normal limits. Both lungs are clear. The visualized skeletal structures are unremarkable. IMPRESSION: No active disease. Electronically Signed   By: Ashley Royalty M.D.   On: 12/19/2017 03:50   Dg Toe Great Right  Result Date: 12/20/2017 CLINICAL DATA:  Positive blood cultures with right great toe pain. Evaluate for osteomyelitis. EXAM: RIGHT GREAT TOE COMPARISON:  None. FINDINGS: No cortical bone destruction or fracture. No joint dislocation of the right great toe. No marginal nor extra articular erosions. No periostitis. No soft tissue mass, emphysema or significant soft tissue swelling. IMPRESSION: Negative for acute fracture, joint dislocation or bone destruction. Electronically Signed   By: Ashley Royalty M.D.   On: 12/20/2017 00:33   IMPRESSION AND PLAN:    A/P: 68M w/ Hx T-cell lymphoblastic lymphoma (on maintenance chemoTx) p/w neutropenic fever, SIRS, dehydration, pancytopenia. Hyperglycemia, hypocalcemia, mild transaminasemia. -Neutropenic fever, SIRS: Pt presented to [facility] ED on Monday 12/18/2017 evening w/ fever. In consultation w/ pt's primary/Oncology group, pt received single dose of IV Ceftriaxone, IVF, and went home. He was called back for (+) BCx. BCx result is coagulase (-) Staph, possible contaminant. Lactate 1.0 today. PCT yesterday was < 0.10. WBC < 4, tachycardic, febrile, SIRS (+). No obvious source of infxn. Denies cough/SOB, N/V/D/AP, urinary symptoms. Lungs clear, abdomen soft. CXR (-), U/A pending. XR R great toe (-). Looks well, does not appear toxic. Low suspicion for sepsis 2/2 acute bacterial infxn. Possibility of viral infxn exists, PCR pending. Fungal infxn less likely, Fungitell pending. Covered w/ Vancomycin + Zosyn, but pt can be de-escalated during the day. I anticipate pt wil be stable for D/C later in the day on Wednesday 12/20/2017. -Dehydration: Endorses dehydration. IVF, encourage oral intake. -Pancytopenia: Labwork demonstrates leukopenia (WBC 1.9) with moderate neutropenia (ANC 800), normocytic anemia (Hgb 12.1, MCV 93.7), severe thrombocytopenia (Plt 20). Stable from 09/16 ED labwork, and, per pt, values stable and consistent w/ outpt labwork. States this  to be anticipated in light of his oncologic management, with maintenance therapy presently being held for exactly this reason. -Hyperglycemia: Likely 2/2 lymphoma, steroids, dehydration. IVF. -Hypocalcemia: Ionized calcium. -Transaminasemia: AST WNL, ALT 101 (> 2x ULN, < 3x ULN). Likely 2/2 lymphoma, dehydration. Monitor. -c/w other home meds. -FEN/GI: Regular diet. -DVT PPx: SCDs, no pharmacological DVT PPx (Plt < 50). -Code status: Full code. -Disposition: Observation, pt expected to stay < 2 midnights. Pt wishes to go home later in the day on Wednesday  12/20/2017, which I anticipate will be entirely possible. -Fragmentation of care: Had a long discussion w/ pt regarding fragmentation of care between [place], [place] and the local area. He is currently attending [facility]. He has not had any significant issues necessitating care away from [facility] up until yesterday. As his prior care was not in [place], records are not available at present. He is aware that the situation is less than ideal.   All the records are reviewed and case discussed with ED provider. Management plans discussed with the patient, family and they are in agreement.  CODE STATUS: Full code.  TOTAL TIME TAKING CARE OF THIS PATIENT: 100 minutes.    Arta Silence M.D on 12/20/2017 at 2:30 AM  Between 7am to 6pm - Pager - [phone]  After 6pm go to [email or web address] - Proofreader  [facility]  Office  [phone]  CC: Primary care physician; System, Pcp Not In   Note: This dictation was prepared with Dragon dictation along with smaller phrase technology. Any transcriptional errors that result from this process are unintentional.

## 2017-12-20 NOTE — Progress Notes (Signed)
Pharmacy Antibiotic Note  Paul Knox is a 22 y.o. male admitted on 12/19/2017 with febrile neutropenia.  Pharmacy has been consulted for vancomycin and Zosyn dosing.  Plan: Zosyn 3.375g IV q8h (4 hour infusion).   DW 74kg  Vd 53L kei 0.1 hr-1  T1/2 7 hours Vancomycin 1 gram q 8 hours ordered with stacked dosing. Level before 5th dose. Goal trough 15-20   Height: 6\' 2"  (188 cm) Weight: 162 lb 0.6 oz (73.5 kg) IBW/kg (Calculated) : 82.2  Temp (24hrs), Avg:100.4 F (38 C), Min:100 F (37.8 C), Max:100.8 F (38.2 C)  Recent Labs  Lab 12/19/17 0255 12/19/17 0302 12/20/17 0009  WBC  --  1.6* 1.9*  CREATININE  --  0.75 0.70  LATICACIDVEN 1.3  --  1.0    Estimated Creatinine Clearance: 151.8 mL/min (by C-G formula based on SCr of 0.7 mg/dL).    Allergies  Allergen Reactions  . Ciprofloxacin     Antimicrobials this admission: Vancomycin, Zosyn 9/18  >>    >>   Dose adjustments this admission:   Microbiology results: 9/18 BCx: pending 9/17 BCID Staph spp mecA(-)  Thank you for allowing pharmacy to be a part of this patient's care.  Paul Knox S 12/20/2017 3:46 AM

## 2017-12-20 NOTE — ED Provider Notes (Signed)
[facility] Emergency Department Provider Note    First MD Initiated Contact with Patient 12/20/17 0000     (approximate)  I have reviewed the triage vital signs and the nursing notes.   HISTORY  Chief Complaint Fever    HPI Paul Knox is a 22 y.o. male history of lymphoblastic lymphoma seen last night for neutropenic fever chills with chief complaint of headache myalgias.  States that those symptoms have improved over 80%.  Was given a dose of Rocephin last night.  Presents the ER today after blood culture from single bottle did grow out staph species coagulase susceptible.  He was not discharged on any antibiotics after discussion with oncology last night.  States he still having temperatures over the past 24 hours.  Is also had a recurrent right great toe infection from ingrown toenail.      Past Medical History:  Diagnosis Date  . Lymphoblastic lymphoma ([place])    History reviewed. No pertinent family history. History reviewed. No pertinent surgical history. Patient Active Problem List   Diagnosis Date Noted  . Neutropenic fever ([place]) 12/19/2017      Prior to Admission medications   Medication Sig Start Date End Date Taking? Authorizing Provider  amphetamine-dextroamphetamine (ADDERALL XR) 10 MG 24 hr capsule Take 1 capsule by mouth daily. 12/07/17  Yes [provider]  LORazepam (ATIVAN) 1 MG tablet Take 1 mg by mouth every 6 (six) hours as needed for anxiety.   Yes [provider]  mercaptopurine (PURINETHOL) 50 MG tablet Take 3 tablets by mouth daily. 12/07/17  Yes [provider]  methotrexate (RHEUMATREX) 2.5 MG tablet Take 14 tablets by mouth every 7 (seven) days. 12/07/17  Yes [provider]  ondansetron (ZOFRAN-ODT) 8 MG disintegrating tablet Take 8 mg by mouth every 8 (eight) hours as needed for nausea or vomiting.   Yes [provider]  ranitidine (ZANTAC) 150 MG tablet Take 1 tablet by mouth  2 (two) times daily. 12/14/17  Yes [provider]  dexamethasone (DECADRON) 1 MG tablet TAKE 6 TABS BY MOUTH TWICE A DAY FOR 5 DAYS 12/11/17   [provider]  sulfamethoxazole-trimethoprim (BACTRIM DS,SEPTRA DS) 800-160 MG tablet Take 1 tablet by mouth 2 (two) times daily. 12/07/17   [provider]    Allergies Ciprofloxacin    Social History Social History   Tobacco Use  . Smoking status: Never Smoker  . Smokeless tobacco: Never Used  Substance Use Topics  . Alcohol use: Not on file  . Drug use: Not on file    Review of Systems Patient denies headaches, rhinorrhea, blurry vision, numbness, shortness of breath, chest pain, edema, cough, abdominal pain, nausea, vomiting, diarrhea, dysuria, fevers, rashes or hallucinations unless otherwise stated above in HPI. ____________________________________________   PHYSICAL EXAM:  VITAL SIGNS: Vitals:   12/20/17 0135 12/20/17 0137  BP: (!) 126/93   Pulse: (!) 112   Resp:  17  Temp:    SpO2: 99%     Constitutional: Alert and oriented.  Eyes: Conjunctivae are normal.  Head: Atraumatic. Nose: No congestion/rhinnorhea. Mouth/Throat: Mucous membranes are moist.   Neck: No stridor. Painless ROM.  Cardiovascular: Normal rate, regular rhythm. Grossly normal heart sounds.  Good peripheral circulation. Respiratory: Normal respiratory effort.  No retractions. Lungs CTAB. Gastrointestinal: Soft and nontender. No distention. No abdominal bruits. No CVA tenderness. Genitourinary:  Musculoskeletal: No lower extremity tenderness nor edema.  No joint effusions.  Slight swelling and erythema to right great toe Neurologic:  Normal speech and language. No gross focal neurologic deficits are appreciated. No facial droop Skin:  Skin is warm, dry and intact. No rash noted. Psychiatric: Mood and affect are normal. Speech and behavior are normal.  ____________________________________________   LABS (all labs ordered are  listed, but only abnormal results are displayed)  Results for orders placed or performed during the hospital encounter of 12/19/17 (from the past 24 hour(s))  Lactic acid, plasma     Status: None   Collection Time: 12/20/17 12:09 AM  Result Value Ref Range   Lactic Acid, Venous 1.0 0.5 - 1.9 mmol/L  Comprehensive metabolic panel     Status: Abnormal   Collection Time: 12/20/17 12:09 AM  Result Value Ref Range   Sodium 135 135 - 145 mmol/L   Potassium 3.8 3.5 - 5.1 mmol/L   Chloride 98 98 - 111 mmol/L   CO2 28 22 - 32 mmol/L   Glucose, Bld 109 (H) 70 - 99 mg/dL   BUN 16 6 - 20 mg/dL   Creatinine, Ser 0.70 0.61 - 1.24 mg/dL   Calcium 8.4 (L) 8.9 - 10.3 mg/dL   Total Protein 6.2 (L) 6.5 - 8.1 g/dL   Albumin 4.1 3.5 - 5.0 g/dL   AST 24 15 - 41 U/L   ALT 101 (H) 0 - 44 U/L   Alkaline Phosphatase 64 38 - 126 U/L   Total Bilirubin 0.9 0.3 - 1.2 mg/dL   GFR calc non Af Amer >60 >60 mL/min   GFR calc Af Amer >60 >60 mL/min   Anion gap 9 5 - 15  CBC WITH DIFFERENTIAL     Status: Abnormal   Collection Time: 12/20/17 12:09 AM  Result Value Ref Range   WBC 1.9 (L) 3.8 - 10.6 K/uL   RBC 3.60 (L) 4.40 - 5.90 MIL/uL   Hemoglobin 12.1 (L) 13.0 - 18.0 g/dL   HCT 33.8 (L) 40.0 - 52.0 %   MCV 93.7 80.0 - 100.0 fL   MCH 33.6 26.0 - 34.0 pg   MCHC 35.9 32.0 - 36.0 g/dL   RDW 14.7 (H) 11.5 - 14.5 %   Platelets 20 (LL) 150 - 440 K/uL   Neutrophils Relative % 42 %   Lymphocytes Relative 32 %   Monocytes Relative 13 %   Eosinophils Relative 5 %   Basophils Relative 0 %   Band Neutrophils 1 %   Metamyelocytes Relative 1 %   Myelocytes 0 %   Promyelocytes Relative 0 %   Blasts 0 %   nRBC 1 (H) 0 /100 WBC   Other 6 %   Neutro Abs 0.8 (L) 1.4 - 6.5 K/uL   Lymphs Abs 0.6 (L) 1.0 - 3.6 K/uL   Monocytes Absolute 0.2 0.2 - 1.0 K/uL   Eosinophils Absolute 0.1 0 - 0.7 K/uL   Basophils Absolute 0.0 0 - 0.1 K/uL   RBC Morphology MIXED RBC POPULATION     ____________________________________________  RADIOLOGY  I personally reviewed all radiographic images ordered to evaluate for the above acute complaints and reviewed radiology reports and findings.  These findings were personally discussed with the patient.  Please see medical record for radiology report.  ____________________________________________   PROCEDURES  Procedure(s) performed:  Procedures    Critical Care performed: no ____________________________________________   INITIAL IMPRESSION / ASSESSMENT AND PLAN / ED COURSE  Pertinent labs & imaging results that were available during my care of the patient were reviewed  by me and considered in my medical decision making (  see chart for details).   DDX: sepsis, bacteremia, neutropenic fever, viral illness, abscess, osteomyelitis  Jamar Weatherall is a 22 y.o. who presents to the ED with symptoms as described above with neutropenic fever.  Patient currently undergoing maintenance therapy did have a single blood culture bottle grew methicillin sensitive staph species.  We will give another dose of IV Rocephin.  He still having fevers and chills I do believe it prudent to have patient admitted to the hospital for additional hemodynamic monitoring, IV fluids as well as monitoring of fever curve.  Right great toe appears appropriate with no evidence of osteomyelitis.  Have discussed with the patient and available family all diagnostics and treatments performed thus far and all questions were answered to the best of my ability. The patient demonstrates understanding and agreement with plan.       As part of my medical decision making, I reviewed the following data within the [place] notes reviewed and incorporated, Labs reviewed, notes from prior ED visits.   ____________________________________________   FINAL CLINICAL IMPRESSION(S) / ED DIAGNOSES  Final diagnoses:  Neutropenic fever ([place])       NEW MEDICATIONS STARTED DURING THIS VISIT:  New Prescriptions   No medications on file     Note:  This document was prepared using Dragon voice recognition software and may include unintentional dictation errors.    Merlyn Lot, MD 12/20/17 [phone]

## 2017-12-20 NOTE — ED Notes (Signed)
ED Provider at bedside. 

## 2017-12-20 NOTE — Progress Notes (Signed)
Patient briefly seen and examined.  Patient here with neutropenic fever.  Continue broad-spectrum antibiotics.  Patient wants to go home however I discussed with him that I cannot discharge him home until I have the information and need from microbiology which likely will not be available until tomorrow.

## 2017-12-20 NOTE — ED Notes (Signed)
Pt assisted to bathroom. Remains alert and oriented. NAD

## 2017-12-21 LAB — PROCALCITONIN

## 2017-12-21 LAB — CALCIUM, IONIZED: Calcium, Ionized, Serum: 4.5 mg/dL (ref 4.5–5.6)

## 2017-12-21 LAB — HIV ANTIBODY (ROUTINE TESTING W REFLEX): HIV SCREEN 4TH GENERATION: NONREACTIVE

## 2017-12-21 MED ORDER — AMOXICILLIN-POT CLAVULANATE 875-125 MG PO TABS: 1.0000 | ORAL_TABLET | Freq: Two times a day (BID) | ORAL | 0 refills | Status: AC

## 2017-12-21 NOTE — Discharge Summary (Signed)
[facility] [place] NAME: Paul Knox    MR#:  [id number]  DATE OF BIRTH:  [birth year]  DATE OF ADMISSION:  12/19/2017 ADMITTING PHYSICIAN: Arta Silence, MD  DATE OF DISCHARGE: 12/21/2017  PRIMARY CARE PHYSICIAN: System, Pcp Not In    ADMISSION DIAGNOSIS:  Neutropenic fever ([place]) [D70.9, R50.81]  DISCHARGE DIAGNOSIS:  Active Problems:   Neutropenic fever ([place])   SECONDARY DIAGNOSIS:   Past Medical History:  Diagnosis Date  . 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  1.  Neutropenic fever: Initial blood cultures taken in the ER reporting 1 out of 4 bottles GPC which is a contaminant.  Repeat blood cultures have been negative.  Patient will be discharged on Augmentin.  He is allergic to ciprofloxacin.  2.  Acute T-cell lymphoma/leukemia: Patient was evaluated by oncologist while in the hospital.  He will finish maintenance therapy July 2020.     DISCHARGE CONDITIONS AND DIET:   Stable for discharge on regular diet  CONSULTS OBTAINED:  Treatment Team:  Arta Silence, MD Cammie Sickle, MD  DRUG ALLERGIES:   Allergies  Allergen Reactions  . Ciprofloxacin     DISCHARGE MEDICATIONS:   Allergies as of 12/21/2017      Reactions   Ciprofloxacin       Medication List    TAKE these medications   amoxicillin-clavulanate 875-125 MG tablet Commonly known as:  AUGMENTIN Take 1 tablet by mouth 2 (two) times daily.   amphetamine-dextroamphetamine 10 MG 24 hr capsule Commonly known as:  ADDERALL XR Take 1 capsule by mouth daily.   dexamethasone 1 MG tablet Commonly known as:  DECADRON TAKE 6 TABS BY MOUTH TWICE A DAY FOR 5 DAYS   LORazepam 1 MG tablet Commonly known as:  ATIVAN Take 1 mg by mouth every 6 (six) hours as needed for anxiety.    mercaptopurine 50 MG tablet Commonly known as:  PURINETHOL Take 3 tablets by mouth daily.   methotrexate 2.5 MG tablet Commonly known as:  RHEUMATREX Take 14 tablets by mouth every 7 (seven) days.   ondansetron 8 MG disintegrating tablet Commonly known as:  ZOFRAN-ODT Take 8 mg by mouth every 8 (eight) hours as needed for nausea or vomiting.   ranitidine 150 MG tablet Commonly known as:  ZANTAC Take 1 tablet by mouth 2 (two) times daily.   sulfamethoxazole-trimethoprim 800-160 MG tablet Commonly known as:  BACTRIM DS,SEPTRA DS Take 1 tablet by mouth 2 (two) times daily.         Today   CHIEF COMPLAINT:   Ready to go home   VITAL SIGNS:  Blood pressure 121/69, pulse 77, temperature 97.7 F (36.5 C), temperature source Oral, resp. rate 20, height 6\' 2"  (1.88 m), weight 76.9 kg, SpO2 98 %.   REVIEW OF SYSTEMS:  Review of Systems  Constitutional: Negative for chills, fever and malaise/fatigue.  HENT: Negative.  Negative for ear discharge, ear pain, hearing loss, nosebleeds and sore throat.   Eyes: Negative.  Negative for blurred vision and pain.  Respiratory: Negative.  Negative for cough, hemoptysis, shortness of breath and wheezing.   Cardiovascular: Negative.  Negative for chest pain, palpitations and leg swelling.  Gastrointestinal: Negative.  Negative for abdominal pain, blood in stool, diarrhea, nausea and vomiting.  Genitourinary: Negative.  Negative for dysuria.  Musculoskeletal: Negative.  Negative for back pain.  Skin: Negative.   Neurological: Negative for dizziness, tremors, speech change, focal weakness, seizures and headaches.  Endo/Heme/Allergies: Negative.  Does not bruise/bleed easily.  Psychiatric/Behavioral: Negative.  Negative for depression, hallucinations and suicidal ideas.     PHYSICAL EXAMINATION:  GENERAL:  22 y.o.-year-old patient lying in the bed with no acute distress.  NECK:  Supple, no jugular venous distention. No thyroid  enlargement, no tenderness.  LUNGS: Normal breath sounds bilaterally, no wheezing, rales,rhonchi  No use of accessory muscles of respiration.  CARDIOVASCULAR: S1, S2 normal. No murmurs, rubs, or gallops.  ABDOMEN: Soft, non-tender, non-distended. Bowel sounds present. No organomegaly or mass.  EXTREMITIES: No pedal edema, cyanosis, or clubbing.  PSYCHIATRIC: The patient is alert and oriented x 3.  SKIN: No obvious rash, lesion, or ulcer.   DATA REVIEW:   CBC Recent Labs  Lab 12/20/17 0758  WBC 1.8*  HGB 11.0*  HCT 30.3*  PLT 16*    Chemistries  Recent Labs  Lab 12/19/17 0302 12/20/17 0009 12/20/17 0758  NA 135 135 138  K 3.6 3.8 4.1  CL 98 98 104  CO2 31 28 29   GLUCOSE 112* 109* 102*  BUN 18 16 12   CREATININE 0.75 0.70 0.60*  CALCIUM 8.6* 8.4* 8.3*  MG 1.9  --   --   AST 31 24  --   ALT 117* 101*  --   ALKPHOS 59 64  --   BILITOT 0.7 0.9  --     Cardiac Enzymes No results for input(s): TROPONINI in the last 168 hours.  Microbiology Results  @MICRORSLT48 @  RADIOLOGY:  Dg Toe Great Right  Result Date: 12/20/2017 CLINICAL DATA:  Positive blood cultures with right great toe pain. Evaluate for osteomyelitis. EXAM: RIGHT GREAT TOE COMPARISON:  None. FINDINGS: No cortical bone destruction or fracture. No joint dislocation of the right great toe. No marginal nor extra articular erosions. No periostitis. No soft tissue mass, emphysema or significant soft tissue swelling. IMPRESSION: Negative for acute fracture, joint dislocation or bone destruction. Electronically Signed   By: Ashley Royalty M.D.   On: 12/20/2017 00:33      Allergies as of 12/21/2017      Reactions   Ciprofloxacin       Medication List    TAKE these medications   amoxicillin-clavulanate 875-125 MG tablet Commonly known as:  AUGMENTIN Take 1 tablet by mouth 2 (two) times daily.   amphetamine-dextroamphetamine 10 MG 24 hr capsule Commonly known as:  ADDERALL XR Take 1 capsule by mouth daily.    dexamethasone 1 MG tablet Commonly known as:  DECADRON TAKE 6 TABS BY MOUTH TWICE A DAY FOR 5 DAYS   LORazepam 1 MG tablet Commonly known as:  ATIVAN Take 1 mg by mouth every 6 (six) hours as needed for anxiety.   mercaptopurine 50 MG tablet Commonly known as:  PURINETHOL Take 3 tablets by mouth daily.   methotrexate 2.5 MG tablet Commonly known as:  RHEUMATREX Take 14 tablets by mouth every 7 (seven) days.   ondansetron 8 MG disintegrating tablet Commonly known as:  ZOFRAN-ODT Take 8 mg by mouth every 8 (eight) hours as needed for nausea or vomiting.   ranitidine 150 MG tablet Commonly known as:  ZANTAC Take 1 tablet by mouth 2 (two) times daily.   sulfamethoxazole-trimethoprim 800-160 MG tablet Commonly known as:  BACTRIM DS,SEPTRA DS Take 1 tablet by mouth 2 (two) times daily.         Management plans  discussed with the patient and he is in agreement. Stable for discharge home  Patient should follow up with oncology  CODE STATUS:     Code Status Orders  (From admission, onward)         Start     Ordered   12/20/17 0342  Full code  Continuous     12/20/17 0341        Code Status History    This patient has a current code status but no historical code status.      TOTAL TIME TAKING CARE OF THIS PATIENT: 38 minutes.    Note: This dictation was prepared with Dragon dictation along with smaller phrase technology. Any transcriptional errors that result from this process are unintentional.  Bobak Oguinn M.D on 12/21/2017 at 7:35 AM  Between 7am to 6pm - Pager - [phone] After 6pm go to [email or web address] - password [email or web address] [facility]  Office  [phone]  CC: Primary care physician; System, Pcp Not In

## 2017-12-22 LAB — CULTURE, BLOOD (ROUTINE X 2)

## 2017-12-22 LAB — FUNGITELL, SERUM

## 2017-12-24 LAB — CULTURE, BLOOD (ROUTINE X 2): Culture: NO GROWTH

## 2017-12-25 LAB — CULTURE, BLOOD (ROUTINE X 2)
CULTURE: NO GROWTH
Culture: NO GROWTH
Special Requests: ADEQUATE
Special Requests: ADEQUATE

## 2017-12-29 ENCOUNTER — Encounter: Payer: Self-pay | Admitting: Emergency Medicine

## 2017-12-29 ENCOUNTER — Emergency Department
Admission: EM | Admit: 2017-12-29 | Discharge: 2017-12-29 | Disposition: A | Discharge status: Home / Self Care | Payer: BLUE CROSS/BLUE SHIELD | Attending: Student in an Organized Health Care Education/Training Program | Admitting: Student in an Organized Health Care Education/Training Program

## 2017-12-29 ENCOUNTER — Other Ambulatory Visit: Payer: Self-pay

## 2017-12-29 DIAGNOSIS — Z202 Contact with and (suspected) exposure to infections with a predominantly sexual mode of transmission: Secondary | ICD-10-CM

## 2017-12-29 DIAGNOSIS — Z79899 Other long term (current) drug therapy: Secondary | ICD-10-CM | POA: Insufficient documentation

## 2017-12-29 DIAGNOSIS — Z113 Encounter for screening for infections with a predominantly sexual mode of transmission: Secondary | ICD-10-CM | POA: Diagnosis present

## 2017-12-29 DIAGNOSIS — T7621XA Adult sexual abuse, suspected, initial encounter: Secondary | ICD-10-CM | POA: Insufficient documentation

## 2017-12-29 DIAGNOSIS — Z8579 Personal history of other malignant neoplasms of lymphoid, hematopoietic and related tissues: Secondary | ICD-10-CM | POA: Insufficient documentation

## 2017-12-29 LAB — CHLAMYDIA/NGC RT PCR (ARMC ONLY)
Chlamydia Tr: NOT DETECTED
N GONORRHOEAE: NOT DETECTED

## 2017-12-29 NOTE — ED Notes (Signed)
Pt refused rapid HIV blood draw, MD aware
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
## 2017-12-29 NOTE — ED Notes (Signed)
Called lab to inquire about test result and per [facility] she said it would be 55 more minutes. MD informed of delay

## 2017-12-29 NOTE — ED Notes (Signed)
Esign not working at this time. Pt verbalized discharge instructions and has no questions at this time. 
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
## 2019-01-22 ENCOUNTER — Other Ambulatory Visit: Payer: Self-pay

## 2019-01-22 DIAGNOSIS — Z20822 Contact with and (suspected) exposure to covid-19: Secondary | ICD-10-CM

## 2019-01-24 LAB — NOVEL CORONAVIRUS, NAA: SARS-CoV-2, NAA: NOT DETECTED

## 2020-04-21 IMAGING — DX DG TOE GREAT 2+V*R*
3 series · 3 of 3 positions shown · non-contrast
Comparison: None.

CLINICAL DATA: Positive blood cultures with right great toe pain.
Evaluate for osteomyelitis.

EXAM:
RIGHT GREAT TOE

[toe ap]
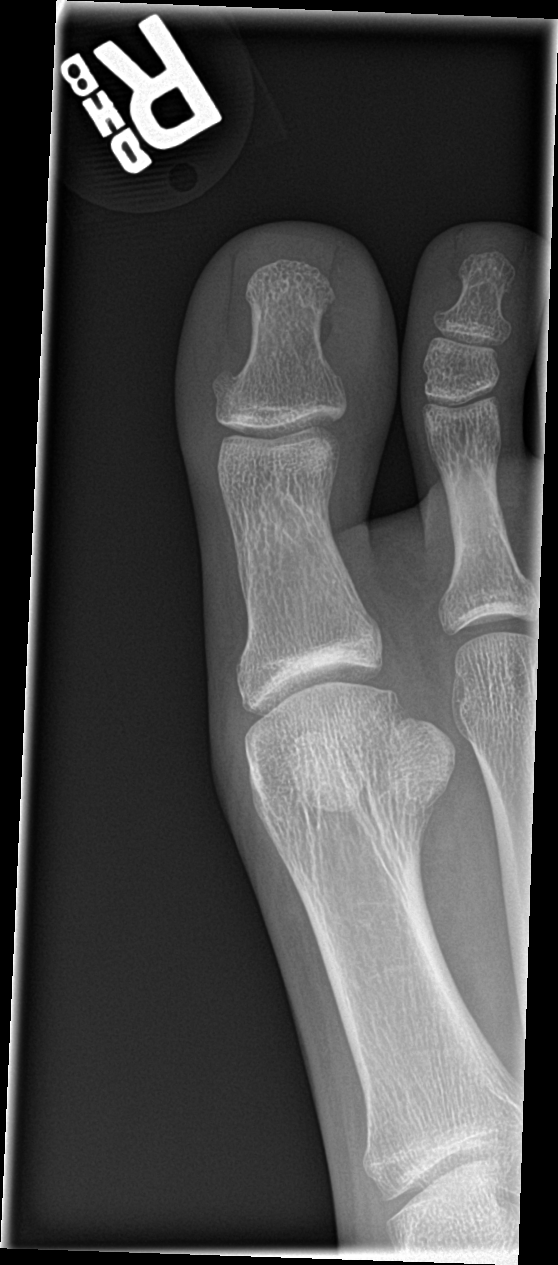

[toe obl]
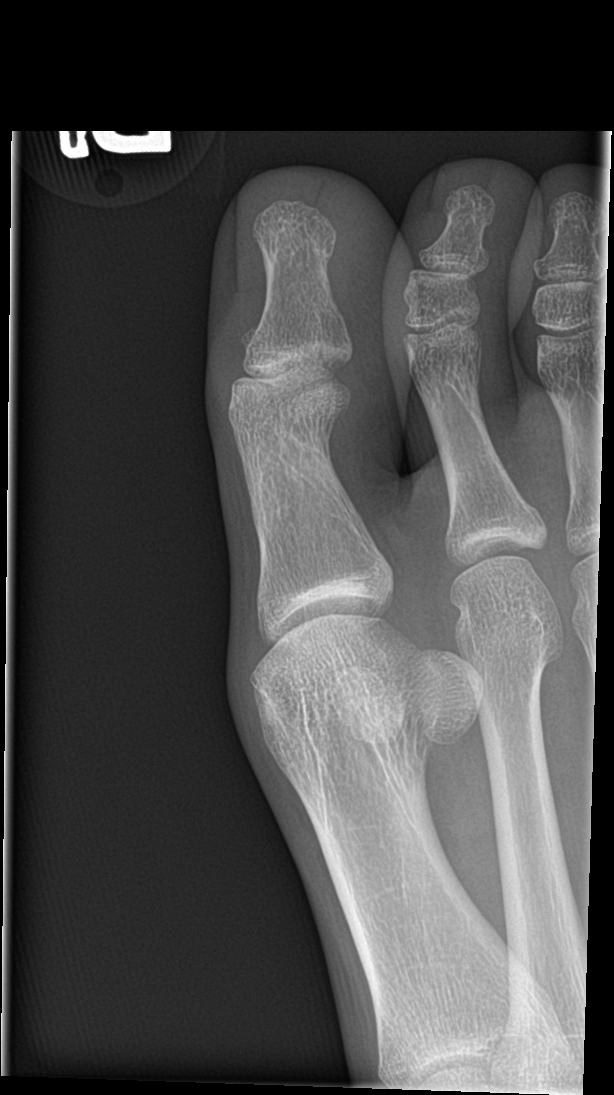

[toe lat]
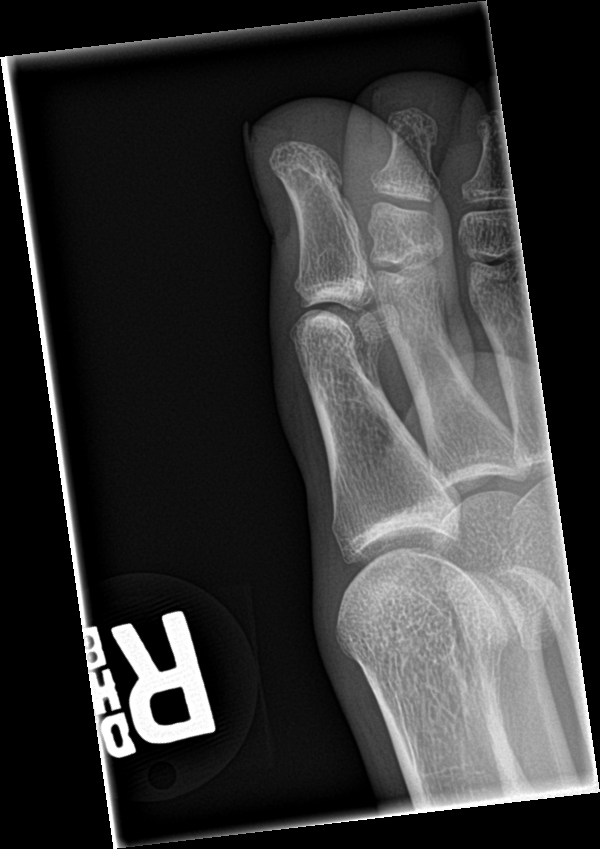

[3 of 3 positions shown; findings below may reference images not displayed]

FINDINGS: No cortical bone destruction or fracture. No joint dislocation of
the right great toe. No marginal nor extra articular erosions. No
periostitis. No soft tissue mass, emphysema or significant soft
tissue swelling.
IMPRESSION: Negative for acute fracture, joint dislocation or bone destruction.

## 2020-06-05 ENCOUNTER — Telehealth: Payer: Self-pay | Admitting: Hematology & Oncology

## 2020-06-05 NOTE — Telephone Encounter (Signed)
Scheduled on 06/11/20. Provided fax# [phone] to send notes.

## 2020-06-05 NOTE — Telephone Encounter (Signed)
Patient's mother calling to schedule appointment and call transferred to [facility]

## 2020-06-11 ENCOUNTER — Other Ambulatory Visit
Admission: RE | Admit: 2020-06-11 | Discharge: 2020-06-11 | Disposition: A | Discharge status: Home Health | Payer: BC Managed Care – PPO

## 2020-06-11 ENCOUNTER — Encounter: Payer: Self-pay | Admitting: Hematology & Oncology

## 2020-06-11 ENCOUNTER — Other Ambulatory Visit: Admit: 2020-06-11 | Payer: BC Managed Care – PPO

## 2020-06-11 ENCOUNTER — Telehealth: Payer: Self-pay | Admitting: Hematology & Oncology

## 2020-06-11 ENCOUNTER — Ambulatory Visit: Payer: BC Managed Care – PPO | Attending: Hematology & Oncology | Admitting: Hematology & Oncology

## 2020-06-11 DIAGNOSIS — Z6821 Body mass index (BMI) 21.0-21.9, adult: Secondary | ICD-10-CM | POA: Insufficient documentation

## 2020-06-11 DIAGNOSIS — C8350 Lymphoblastic (diffuse) lymphoma, unspecified site: Secondary | ICD-10-CM | POA: Insufficient documentation

## 2020-06-11 LAB — CBC WITH DIFF, BLOOD
ANC automated: 3.6 10*3/uL (ref 2.0–8.1)
Basophils %: 1 %
Basophils Absolute: 0.1 10*3/uL (ref 0.0–0.2)
Eosinophils %: 17.4 %
Eosinophils Absolute: 1.5 10*3/uL — ABNORMAL HIGH (ref 0.0–0.5)
Hematocrit: 44.3 % (ref 39.5–50.0)
Hgb: 14.6 G/DL (ref 13.5–16.9)
Lymphocytes %: 28.8 %
Lymphocytes Absolute: 2.4 10*3/uL (ref 0.9–3.3)
MCH: 29.3 PG (ref 27.0–33.5)
MCHC: 33.1 G/DL (ref 32.0–35.5)
MCV: 88.7 FL (ref 81.5–97.0)
MPV: 6.9 FL — ABNORMAL LOW (ref 7.2–11.7)
Monocytes %: 10 %
Monocytes Absolute: 0.8 10*3/uL (ref 0.0–0.8)
Neutrophils % (A): 42.8 %
PLT Count: 230 10*3/uL (ref 150–400)
RBC: 4.99 10*6/uL (ref 4.38–5.62)
RDW-CV: 14.3 % (ref 11.6–14.4)
White Bld Cell Count: 8.3 10*3/uL (ref 4.0–10.5)

## 2020-06-11 LAB — COMPREHENSIVE METABOLIC PANEL, BLOOD
ALT: 11 U/L (ref 7–52)
AST: 15 U/L (ref 13–39)
Albumin: 4.5 G/DL (ref 4.2–5.5)
Alk Phos: 63 U/L (ref 34–104)
BUN: 16 mg/dL (ref 7–25)
Bilirubin, Total: 0.3 mg/dL (ref 0.0–1.4)
CO2: 30 mmol/L (ref 21–31)
Calcium: 9.1 mg/dL (ref 8.6–10.3)
Chloride: 104 mmol/L (ref 98–107)
Creat: 0.7 mg/dL (ref 0.7–1.3)
Electrolyte Balance: 6 mmol/L (ref 2–12)
Glucose: 97 mg/dL (ref 70–115)
Potassium: 4 mmol/L (ref 3.5–5.1)
Protein, Total: 7.1 G/DL (ref 6.0–8.3)
Sodium: 140 mmol/L (ref 136–145)
eGFR - high estimate: >60 (ref >59)
eGFR - low estimate: >60 (ref >59)

## 2020-06-11 LAB — LDH, BLOOD: LDH: 132 U/L (ref 96–199)

## 2020-06-11 MED ORDER — FEXOFENADINE HCL 30 MG OR TABS: 1.00 | ORAL_TABLET | ORAL | Status: AC

## 2020-06-11 MED ORDER — AMPHETAMINE-DEXTROAMPHETAMINE 10 MG OR CP24
10.00 mg | ORAL_CAPSULE | ORAL | Status: AC
Start: 2018-09-28 — End: ?

## 2020-06-11 MED ORDER — LORAZEPAM 1 MG OR TABS: 1.00 mg | ORAL_TABLET | ORAL | Status: AC

## 2020-06-11 MED ORDER — FLUTICASONE PROPIONATE 50 MCG/ACT NA SUSP: 1.00 | NASAL | Status: AC

## 2020-06-11 MED ORDER — ONDANSETRON 8 MG OR TBDP: 8.00 mg | ORAL_TABLET | ORAL | Status: AC

## 2020-06-11 NOTE — Telephone Encounter (Signed)
Spoke to patients mom let her know records were not received she will re-faxed the to [phone] pt will come to appt at @ 2:30pm

## 2020-06-11 NOTE — Progress Notes (Signed)
CONSULT    Visit Date:  06/11/2020  Referring Physician:  No Pcp, Per Patient  Primary Physician:  No Pcp, Per Patient  Diagnosis:  Stage IV T-cell lymphoblastic lymphoma  Reason for Referral: Right neck mass felt by patient    History of the Present Illness:  53 M diagnosed with stage IV T-cell lymphoblastic lymphoma in 09/2015 after presenting with a 3.4cm right supraclavicular LN. Diagnosis was made 09/18/15.  Marrow had less than 25% involvement.  PET 09/24/15 showed PET avid disease in Waldeyer's ring, Right supraclavicular, bilateral cervical, axillary, mediastinum, abdominal nodes and osseous disease. He was treated on a clinical trial COG [id number] Arm B with bortezomib, vincristine, daunorubicin, pegaspargase, dex and IT ara-C. This was followed by consolidation therapy and maintenance chemotherapy which he completed 09/2019. He requested an appointment at [facility] oncology due to new palpable nodule in his neck.     He reported being in very good health without any symptoms whatsoever until a week a go when he noticed a nodule in his right neck. Area is non-tender and has been stable in size since he felt it. He has no difficult breathing, denied wheezing or stridor and has no difficulty swallowing. He had not had fever, chills, weight loss or night sweats. Since feeling the nodule he has been having panic attacks and increased anxiety. His appetite has been good, he has no abdominal pain, nausea or vomiting.     He has been very active, exercises daily and denied any muscle or bone pain. He is currently enrolled at [employer] for premed school classes.     Oncology History  The patient was diagnosed with Stage IV T-cell lymphoblastic lymphoma on  09/18/2015 after presenting with a R supraclavicular mass. His initial work-up  was completed in an adult facility at [facility] including the  biopsy however the pathology was sent for a second opinion to Dr. Stark Klein at  [facility] for COG review.  The morphology, immunophenotyping by  flow cytometry and IHC were diagnostic for T-Lymphoblastic Lymphoma. The bone  marrow at diagnosis was positive but less < 25% involvement. The initial MRD  was 19.3%. There was no evidence of CNS or testicular involvement.     The baseline echocardiogram performed on 09/24/2015 was normal with an EF 69% and SF 39.7%.  A PET scan was performed on 09/24/15 which demonstrated abnormal FDG involving  Waldeyers's ring suspicious for metastatic neoplasm, metastatic disease  involving bilateral cervical level 1,2, 3,4 and R SCV adenopathy, scattered  mediastinal adenopathy, bilateral axillary adenopathy, and scattered  mesenteric, retroperitoneal, bilateral iliac a, bilateral external iliac and  bilateral inguinal adenopathy as well as diffuse osseous disease.  He started  Induction Chemotherapy on COG [id number], Arm B T-LL with bortezomib,  vincristine, daunorubicin, pegaspargase, dexamethasone and IT ARA-C  and later  IT MTX. The patient tolerated Induction chemotherapy well and achieved and  excellent radiologic response by PET. The Day 29 MRD was 0.08%. A repeat PET  scan performed on 10/26/2015  demonstrated evidence of interval response to  therapy with mild residual metabolic  activity in multiple bone lesions and in  the region of the Waldeyer's ring.      Consolidation chemotherapy was started on  11/03/2015 with cyclophosphamide, cytarabine, mercaptopurine, vincristine,  pegaspargase, and IT MTX. The patient started Interim Maintenance with HD MTX,  vincristine, mercaptopurine, and IT MTX. Treatment was begun on 05/03/2016 with  Delayed Intensification chemotherapy consisting of bortezomib, vincristine,  dexamethasone, doxorubicin, pegaspargase  and IT MTX.  Interim Maintenance was  initiated on 08/02/2016 with vincristine, methotrexate, pegaspargase, and IT MTX.  Chemotherapy was started on 10/03/2016 with Maintenance with vincristine,  dexamethasone, mercaptopurine, methotrexate  and IT MTX.     He tolerated the remainder of treatment well except episodes of pancytopenia  resulting in delays in starting cycles of treatment as well as dose reduction  during maintenance. His course was complicated in April by an episode of  prolonged fever for which he had a CT was performed which was remarkable for a soft  tissue mass at the GE junction. The mass was biopsied and found to be  consistent with a leiomyoma.     Anthracycline exposure:Alexanderreceived 125 mg/m2 of anthracycline placingAlexanderat increased lifetime risk for cardiomyopathy. Last ECHO (09/2015) revealed normal function, SF-40%, EF-69%. Per long-term follow-up guidelines, requires surveillance ECHOs every 5 years (due 2022).    CNS-directed therapy:Alexanderisat lifelong increased risk for neurocognitive sequelae which can be progressive even beyond 10 years from completion of therapy.    Past Medical History:   Stage IV T-cell lymphoblastic lymphoma  Gastric leiomyoma    Past Surgical History:  Excisional biopsy of cervical lymph node.     Family History:  Breast cancer in paternal grand mother   Maternal G. Mars mother had leukemia.    Social History:  Social History     Socioeconomic History    Marital status: Single     Spouse name: Not on file    Number of children: Not on file    Years of education: Not on file    Highest education level: Not on file   Occupational History    Not on file   Tobacco Use    Smoking status: Never Smoker    Smokeless tobacco: Never Used   Substance and Sexual Activity    Alcohol use: Never    Drug use: Never    Sexual activity: Not on file   Other Topics Concern    Not on file   Social History Narrative    Not on file     Social Determinants of Health     Financial Resource Strain: Not on file   Food Insecurity: Not on file   Transportation Needs: Not on file   Physical Activity: Not on file   Stress: Not on file   Social Connections: Not on file   Intimate Partner Violence:  Not on file   Housing Stability: Not on file       Allergies:  Allergies   Allergen Reactions    Cats Claw, Uncaria Tomentosa Other    Ciprofloxacin Other       Outpatient Medications:  Outpatient Medications Marked as Taking for the 06/11/20 encounter (Office Visit) with Laneta Simmers, MD   Medication Sig Dispense Refill    amphetamine-dextroamphetamine (ADDERALL XR) 10 MG XR capsule 10 mg.      fexofenadine (ALLEGRA) 30 MG tablet 1 tablet by Oral route.      fluticasone propionate (FLONASE) 50 MCG/ACT nasal spray 1 spray.      LORazepam (ATIVAN) 1 MG tablet 1 mg by Oral route.      ondansetron (ZOFRAN ODT) 8 MG disintegrating tablet 8 mg by Oral route.         Review of Systems:  ROS Per HPI    Objective:    Vitals:         BP 143/62 (BP Location: Right arm, BP Patient Position: Sitting, BP cuff size: Regular)  Pulse 85    Temp 97.8 F (36.6 C) (Tympanic)    Resp 18    Ht 6\' 2"  (1.88 m)    Wt 75.2 kg (165 lb 12.6 oz)    BMI 21.29 kg/m     ECOG:  0      General: The patient is alert, cooperative, oriented, well developed and well  nourished. They appear close to chronological age. Clearly anxious and hyperventilating.     HEENT:     Normocephalic, Conjunctiva clear, Sclera anicteric, EOMI  No lesions, exudates, ulcers or masses in the oropharynx or tongue    Neck:          Supple without thyromegaly or JVD. There appeared to be a 1cm firm nodule in right neck around the cricoid cartilage.     Pulmonary:   Breath sounds are symmetric and lungs clear to auscultation bilaterally without rhonchi or wheezes.    Cardiac:     Heart sounds were regular in rate and rhythm.  There were no murmurs, rubs, or gallops.  S1 and S2 were normal.    Abdomen:    Normoactive bowel sounds.  Abdomen was soft, non-tender, non-distended. No guarding or rebound tenderness, no palpable masses or ascites was noted.      No hepatosplenomegaly was noted.                       Back:            No spinal or costovertebral  percussion tenderness    Lymph nodes/Hematologic:    No palpable pre or post auricular, cervical, supraclavicular, axillary, epitrochlear, inguinal or femoral lymphadenopathy.  No petechiae or purpura. Asymmetry in right vs left paratracheal area     Extremities:   No clubbing, cyanosis, or edema.  Pulses are symmetric.    Musculoskeletal:                           No tenderness or swelling in joints, and normal range of motion without obvious weakness.    Skin:               No lesions or rashes    Neurologic:    Alert and oriented x 3.  Gait normal.  No motor deficits.  Cranial nerves 2-12 and sensation grossly intact.    Psychiatric:  Affect and mood appropriate and speech coherent. Verbalized understanding of our discussion today.      Imaging:  Reviewed    Laboratory:  No results found for this or any previous visit.   CBC 05/20/2020  WBC 11 Hb 15.2 Platelets 282 ANC 5.5    Biopsies:  As above    Assessment:  59 M diagnosed with stage IV T-cell lymphoblastic lymphoma in 09/2015 after presenting with a 3.4cm right supraclavicular LN.  He was treated on a clinical trial COG [id number] Arm B with bortezomib, vincristine, daunorubicin, pegaspargase, dex and IT ara-C. This was followed by consolidation therapy and maintenance chemotherapy which he completed 09/2019. He requested an appointment at [facility] oncology due to new palpable nodule in his neck.     Diagnosis: Stage IV T-cell lymphoblastic lymphoma in remission. Last chemotherapy was 09/2019. Patient presents with an asymmetry in his right vs left paratracheal area which he felt a week ago and which is causing him a great deal of anxiety. The significance of this finding is unclear at the moment but given the patient's history it  is reasonable to have this immediately evaluated as it may represent relapse of disease.     Plan  -PET/CT to rule out recurrence of lymphoma. PET is ordered STAT  -Check LDH, CBC with diff, CMP.   -Plan RTC once PET/CT results are back.      Patient seen and case discussed with Dr. Lorelle Gibbs, M.D.  Hematology/Oncology Fellow.   The patient was seen and examined with the fellow and I agree with their findings. Together we developed the plan of care that is reflected in this note.  Lauren C. Pinter-Brown MD  06/13/2020  1:46 PM    Wellbeing Screening    There is no flowsheet data to display.

## 2020-06-11 NOTE — Telephone Encounter (Signed)
Patient mother called regarding patient appointment today and called to confirm that we have received medical records from the onocologist. Please assist.

## 2020-06-17 ENCOUNTER — Encounter: Payer: Self-pay | Admitting: Hematology & Oncology

## 2020-06-19 ENCOUNTER — Telehealth: Payer: Self-pay | Admitting: Hematology & Oncology

## 2020-06-19 NOTE — Telephone Encounter (Signed)
Verdis Frederickson, patients mother wants to move the follow up appointment on [phone] (Zoom) to an earlier date, her sons pet/ct scan is on [id number]. Please advise if possible sooner date for follow up. (nothing available in Epic)

## 2020-06-19 NOTE — Telephone Encounter (Signed)
Informed mother no sooner video visit slot available before 07/07/20. Offered 06/30/20 in person, ok to see Janalyn Rouse NP. May also request to see Dr. Danne Harbor. Lavetta Nielsen, RN

## 2020-06-23 ENCOUNTER — Ambulatory Visit: Payer: BC Managed Care – PPO | Admitting: Hematology & Oncology

## 2020-06-25 ENCOUNTER — Ambulatory Visit: Payer: BC Managed Care – PPO

## 2020-06-26 ENCOUNTER — Ambulatory Visit
Admit: 2020-06-26 | Discharge: 2020-06-26 | Disposition: A | Discharge status: Home / Self Care | Payer: BC Managed Care – PPO | Attending: Diagnostic Radiology | Admitting: Diagnostic Radiology

## 2020-06-26 ENCOUNTER — Ambulatory Visit
Admit: 2020-06-26 | Discharge: 2020-06-26 | Disposition: A | Discharge status: Home Health | Payer: BC Managed Care – PPO | Attending: Hematology & Oncology | Admitting: Hematology & Oncology

## 2020-06-26 DIAGNOSIS — Z9221 Personal history of antineoplastic chemotherapy: Secondary | ICD-10-CM | POA: Insufficient documentation

## 2020-06-26 DIAGNOSIS — K2289 Other specified disease of esophagus: Secondary | ICD-10-CM | POA: Insufficient documentation

## 2020-06-26 DIAGNOSIS — Z8572 Personal history of non-Hodgkin lymphomas: Secondary | ICD-10-CM

## 2020-06-26 DIAGNOSIS — M898X8 Other specified disorders of bone, other site: Secondary | ICD-10-CM | POA: Insufficient documentation

## 2020-06-26 DIAGNOSIS — J341 Cyst and mucocele of nose and nasal sinus: Secondary | ICD-10-CM | POA: Insufficient documentation

## 2020-06-26 DIAGNOSIS — R221 Localized swelling, mass and lump, neck: Secondary | ICD-10-CM

## 2020-06-26 DIAGNOSIS — C8350 Lymphoblastic (diffuse) lymphoma, unspecified site: Secondary | ICD-10-CM

## 2020-06-26 DIAGNOSIS — R590 Localized enlarged lymph nodes: Secondary | ICD-10-CM | POA: Insufficient documentation

## 2020-06-26 DIAGNOSIS — K6389 Other specified diseases of intestine: Secondary | ICD-10-CM | POA: Insufficient documentation

## 2020-06-26 LAB — GLUCOSE, POINT OF CARE: Glucose, Point of Care: 89 MG/DL (ref 70–125)

## 2020-06-26 MED ORDER — FLUDEOXYGLUCOSE F-18 IV KIT
11.9300 | PACK | Freq: Once | INTRAVENOUS | Status: AC
Start: 2020-06-26 — End: 2020-06-26
  Administered 2020-06-26 (×2): 11.93 via INTRAVENOUS

## 2020-06-29 ENCOUNTER — Telehealth: Payer: Self-pay | Admitting: Hematology & Oncology

## 2020-06-29 NOTE — Telephone Encounter (Signed)
Mom called states son girlfriend tested positive for strep throat, wants to know if his appt should be rescheduled or should he get tested before coming in. Mom states he is not experiencing any symptoms. Please assist.

## 2020-06-29 NOTE — Telephone Encounter (Signed)
Spoke to mother, Arville Go. Patient wondering if swollen lymph nodes is due to strep. No additional symptoms. Asking to test in clinic tomorrow. Tiger text sent to [facility] NP. Lavetta Nielsen, RN

## 2020-06-29 NOTE — Telephone Encounter (Signed)
Relayed to mother, Arville Go, that Margreta Journey will do strep swab tomorrow. Lavetta Nielsen, RN

## 2020-06-30 ENCOUNTER — Ambulatory Visit: Payer: BC Managed Care – PPO | Attending: Nurse Practitioner | Admitting: Nurse Practitioner

## 2020-06-30 ENCOUNTER — Encounter: Payer: Self-pay | Admitting: Nurse Practitioner

## 2020-06-30 DIAGNOSIS — C9100 Acute lymphoblastic leukemia not having achieved remission: Secondary | ICD-10-CM

## 2020-06-30 DIAGNOSIS — C8350 Lymphoblastic (diffuse) lymphoma, unspecified site: Secondary | ICD-10-CM | POA: Insufficient documentation

## 2020-06-30 DIAGNOSIS — Z6821 Body mass index (BMI) 21.0-21.9, adult: Secondary | ICD-10-CM

## 2020-06-30 NOTE — Progress Notes (Addendum)
Progress Notes    Visit Date: 06/30/2020    Problem List:   Patient Active Problem List    Diagnosis Date Noted   . 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  He is accompanied by his parents who joined on the phone.  He notes that he has had intimate contact with someone who was diagnosed with strep throat yesterday.  Fever, night sweats, or weight loss are denied.  New palpable masses or lymph nodes are denied. His throat does not hurt but that is not unusual for him.  He feels the same area in the right neck that he felt before.       Allergies:    Allergies   Allergen Reactions   . Cats Claw, Uncaria Tomentosa Other   . Ciprofloxacin Other       Outpatient Medications:   Current Outpatient Medications   Medication Sig Dispense Refill   . amphetamine-dextroamphetamine (ADDERALL XR) 10 MG XR capsule 10 mg.     . fexofenadine (ALLEGRA) 30 MG tablet 1 tablet by Oral route.     . fluticasone propionate (FLONASE) 50 MCG/ACT nasal spray 1 spray.     Marland Kitchen LORazepam (ATIVAN) 1 MG tablet 1 mg by Oral route.     . ondansetron (ZOFRAN ODT) 8 MG disintegrating tablet 8 mg by Oral route.       No current facility-administered medications for this visit.       Social History:   Social History     Socioeconomic History   . Marital status: Single   Tobacco Use   . Smoking status: Never Smoker   . Smokeless tobacco: Never Used   Substance and Sexual Activity   . Alcohol use: Never   . Drug use: Never       Family History: family history is not on file.    Review of Systems:    Constitutional: No fevers, chills, night sweats, excessive fatigue or weight loss.  Allergic/Immunologic: No reactions.  Eyes: No significant visual difficulties.  No diplopia.  ENMT: No problems with hearing, no sore throat, no sinus drainage.  See HPI   Endocrine: No diabetes, thyroid disease or hormone replacement. No hot flashes or night sweats.  Hematologic/Lymphatic: No  easy bruising or bleeding.  The patient denies any tender or palpable lymph nodes.  Respiratory: No dyspnea on exertion, chest pain, cough or hemoptysis  Cardiovascular: No angina chest pain, palpitations or orthopnea.  Gastrointestinal: No nausea, vomiting, diarrhea, GI bleeding, or constipation.  No change in bowel habits, no heartburn or early satiety.  Genitourinary: No hematuria, dysuria, increased frequency, urgency, hesitancy or incontinence.  Musculoskeletal: No joint pain, edema, swelling or redness.  No decreased range of motion.  Integumentary: No urticarial,  pruritus, nail changes or blistering.    Neurologic: No headache, blurred vision, and no areas of focal weakness or numbness.  Normal gait.  No sensory problems.  Psychiatric: No insomnia, depression, mania or mood swings.  No psychotropic drugs.    Physical Examination:    Vitals: BP 133/86 (BP Location: Left arm, BP Patient Position: Sitting, BP cuff size: Regular)   Pulse 70   Temp 97.7 F (36.5 C) (Temporal)   Resp 18   Ht 6' 2"  (1.88 m)   Wt 75.7 kg (166 lb 12.5 oz)   SpO2 99%   BMI 21.41 kg/m     ECOG: 0  General: On  exam was alert, cooperative, oriented, well developed and well nourished. Appears close to chronological age.  HEENT:  Normocephalic.  Conjunctiva  were clear.  Sclerae anicteric There were no lesions, exudates, ulcers, masses in oropharynx or on tongue. Throat with marked erythema.    Neck: Neck was supple without thyromegaly or JVD  Chest: Chest was symmetric without chest wall deformities.  Pulmonary: Breath sounds were symmetric. Lungs clear to auscultation bilaterally without rhonchi or wheezes.  Cardiac:  Heart sounds were regular rate and rhythm.  There were no murmurs, rubs or gallops.  S1 and S2 were normal.    Abdomen: Normoactive bowel sounds.  Abdomen soft, non-tender, non-distended.no guarding or rebound tenderness.  No palpable  masses or ascites. No hepatomegaly. No splenomegaly  Spine/Back:  No spine  percussion tenderness.  No costovertebral angle tenderness.  Lymph nodes/Hematologic:  No palpable pre or post auricular, cervical, supraclavicular, axillary, epitrochlear,  inguinal or femoral lymphadenopathy.  No petechiae or purpura. Asymmetry in right vs left paratracheal area   Extremities: Without clubbing, cyanosis, or edema.  Pulses were symmetric.  Musculoskeletal: No tenderness or swelling in joints, and normal range of motion without obvious weakness.         Skin: No lesions or rashes.  Neurologic: Alert and oriented X3. Gait normal. No motor deficits. Cranial nerves 2-12 and sensation grossly intact.  Psychiatric: Affect and mood appropriate and speech coherent. Verbalized understanding of our discussion today.    Laboratory:   Results for orders placed or performed in visit on 06/30/20   Strep Culture Group A [facility] eSwab    Specimen: Throat   Result Value Ref Range    Description THROAT     Special Info NONE     Culture Result       NEGATIVE FOR GROUP A BETA HEMOLYTIC STREPTOCOCCUS (Streptococcus pyogenes)   HSV (Type 1, Type 2) DNA by PCR, Qualitative Lesion Throat    Specimen: Throat; Lesion   Result Value Ref Range    Description LESION THROAT     Special Info NONE     Nucleic Acid Detection       NEGATIVE for HERPES VIRUS TYPE 1 and 2 (DNA NOT DETECTED)    Nucleic Acid Detection       An interpretation of Not Detected does not rule out the presence of HSV DNA concentrations below the level of detection of the assay     Results for Johnny Robbins, Johnny Robbins (MRN [id number]) as of 07/13/2020 09:19   Ref. Range 06/11/2020 15:41   Sodium Latest Ref Range: 136 - 145 mmol/L 140   Potassium Latest Ref Range: 3.5 - 5.1 mmol/L 4.0   Chloride Latest Ref Range: 98 - 107 mmol/L 104   CO2 Latest Ref Range: 21 - 31 mmol/L 30   Anion Gap Latest Ref Range: 2 - 12 mmol/L 6   BUN Latest Ref Range: 7 - 25 mg/dL 16   Creatinine Latest Ref Range: 0.7 - 1.3 mg/dL 0.7   GFR Latest Ref Range: >59  >60   eGFR - high estimate Latest  Ref Range: >59  >60   Glucose Latest Ref Range: 70 - 115 mg/dL 97   Calcium Latest Ref Range: 8.6 - 10.3 mg/dL 9.1   Alkaline Phos Latest Ref Range: 34 - 104 U/L 63   ALT (SGPT) Latest Ref Range: 7 - 52 U/L 11   AST (SGOT) Latest Ref Range: 13 - 39 U/L 15   Bilirubin, Tot Latest Ref Range: 0.0 - 1.4  mg/dL 0.3   Albumin Latest Ref Range: 4.2 - 5.5 G/DL 4.5   Total Protein Latest Ref Range: 6.0 - 8.3 G/DL 7.1   LDH Latest Ref Range: 96 - 199 U/L 132   WBC Latest Ref Range: 4.0 - 10.5 THOUS/MCL 8.3   RBC Latest Ref Range: 4.38 - 5.62 MILL/MCL 4.99   Hgb Latest Ref Range: 13.5 - 16.9 G/DL 14.6   Hct Latest Ref Range: 39.5 - 50.0 % 44.3   MCV Latest Ref Range: 81.5 - 97.0 FL 88.7   MCH Latest Ref Range: 27.0 - 33.5 PG 29.3   MCHC Latest Ref Range: 32.0 - 35.5 G/DL 33.1   RDW-CV Latest Ref Range: 11.6 - 14.4 % 14.3   Plt Count Latest Ref Range: 150 - 400 THOUS/MCL 230   MPV Latest Ref Range: 7.2 - 11.7 FL 6.9 (L)   Neutrophils % (A) Latest Units: % 42.8   ANC automated Latest Ref Range: 2.0 - 8.1 THOUS/MCL 3.6   Lymphocytes % Latest Units: % 28.8   Lymphocytes Absolute Latest Ref Range: 0.9 - 3.3 THOUS/MCL 2.4   Monocytes Latest Ref Range: 0.0 - 0.8 THOUS/MCL 0.8   Monocytes % Latest Units: % 10.0   Eosinophils % Latest Units: % 17.4   Eosinophils Absolute Latest Ref Range: 0.0 - 0.5 THOUS/MCL 1.5 (H)   Basophils % Latest Units: % 1.0   Abs Basophils Latest Ref Range: 0.0 - 0.2 THOUS/MCL 0.1   Diff Type Unknown DIFFERENTIAL PERFORMED BY AUTOMATED ANALYSIS     Imaging:    PET/CT FDG SKULL BASE TO MID-THIGH    Exam Date: 06/26/2020 7:50 AM.    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  Subsequent PET CT.    Comparison Study: None available.    Fluorine-18-deoxyglucose (FDG) Skull base to mid thigh PET Scan.    Technique: The patient's blood glucose level was determined to be 89 mg/dl. The patient  received an intravenous injection of 11.9 mCi of fluorine 18-FDG in the left antecubital vein. After appropriate delay of approximately 76 minutes images were acquired on [facility] PET/CT scanner. Scanning was performed from the skull base to mid-thigh. Data was reconstructed into attenuation corrected tomographic slices in the transaxial, coronal and sagittal projections. Note CT portion was performed with a low-dose technique, no intravenous contrast and is primarily for image fusion and localization.    Findings:      Baseline mediastinal blood pool activity: 1.1  SUV.  Baseline hepatic activity: 2.0 SUV.    HEAD/NECK: Dedicated brain PET was not performed. There are FDG avid lymph nodes in the left and right neck involving levels 2 through 4 in the right and levels 2 and 3 on the left. Representative right level 2/3 lymph node at the hyoid level measures 4.5 SUV, size is not well delineated without contrast. Additionally, there is increased uptake bilateral nasopharynx along the fossa of Rosenmuller, nonspecific. Activity involving Waldeyer's ring is mildly prominent, uptake involving lymphatic tissue posterior tongue measures up to 9.5 SUV. There is nonspecific tracer activity nonenlarged thyroid which may represent mild thyroiditis. Incidentally noted are mucous retention cysts bilateral maxillary sinuses.    CHEST: There is increased activity in the anterior mediastinum likely representing normal/reactive thymus tissue measuring up to 2.8 SUV. No significantly FDG avid lymphadenopathy seen in the chest. Tracer activity measuring 1.3 SUV noted nonenlarged right axillary lymph node likely benign. No abnormal  uptake in the lungs. Increased activity in the left ventricular myocardium is probably physiologic. There is a hiatal hernia with asymmetric distal esophageal wall thickening associated with increased FDG uptake measuring 3.4 SUV.    ABDOMEN/PELVIS: Liver activity is heterogeneous without dominant FDG  avid lesion. The liver is not definitely enlarged. The spleen is also not enlarged measuring approximately 10.6 cm. There is no increased activity in the spleen. No significantly FDG avid lymphadenopathy. There is low level uptake involving nonenlarged bilateral inguinal lymph nodes measuring up to 1.6 SUV on the right, probably reactive. There is increased uptake involving a loop of small bowel in the right low pelvis probably distal ileum with activity measuring 4.5 SUV. The appendix is not well visualized as a separate structure.    SKELETON: No dominant FDG avid skeletal lesion. Nonspecific mildly increased diffuse marrow activity is noted.    IMPRESSION:    1.  Moderate activity bilateral cervical lymph nodes measuring up to 4.5 SUV on the right are nonspecific, reactive versus recurrent lymphoma.    2.  Prominent FDG avid lymphoid tissue in Waldeyer's ring concerning for possible lymphoma measuring up to 9.5 SUV at the tongue base.    3.  Focal thickening distal esophagus with moderate increased activity (3.4 SUV) as may be inflammatory versus distal esophageal lesion. Recommend correlation with prior imaging and attention on follow-up.    4.  Increased activity right lower quadrant distal small bowel loop adjacent to the cecum is nonspecific. This may be inflammatory versus neoplasm and lymphoma. This may be better assessed with contrast-enhanced CT if clinically indicated.    5. Nonspecific mildly increased diffuse marrow activity.    Impression and Plan:  Johnny Robbins is a 25 year old male presenting for follow up.  Diagnoses of TLL (T-cell lymphoblastic lymphoma) (CMS-HCC) and T-cell acute lymphoblastic leukemia (ALL) (CMS-HCC) were pertinent to this visit.    1.  PET CT with moderate activity bilateral cervical lymph nodes measuring up to 4.5 SUV on the right are nonspecific, reactive versus recurrent lymphoma.  We swabbed patient's throat today for HSV, Strep and GC. Nodes on PET not  measurable   2.  Labs on 3/10 unremarkable   3.  Will follow up closely in 2 weeks; consider ENT consultation      Caffie Pinto, NP  06/30/2020  4:25 PM  The patient has been discussed with the nurse practitioner and I agree with her findings.  Together we developed the plan of care that is reflected in this note.  Lauren C. Pinter-Brown MD  07/26/2020  2:27 PM    Wellbeing Screening     PROMIS Scores and Additional Questions 06/11/2020    PROMIS Adult Short Form-Global Health Score (Physical) 54.1    PROMIS Adult Short Form-Global Health Score (Mental) 45.8    11. Do you have concerns about your fertility? / Tiene preocupaciones acerca de su fertilidad? No    13. Would you like to speak with anyone about your spiritual needs? / Deseara hablar con alguien acerca de sus necesidades espirituales? No

## 2020-07-01 LAB — HSV (TYPE 1,TYPE 2) DNA BY PCR, QUALITATIVE LESION

## 2020-07-02 LAB — GROUP A STREP CULTURE: Culture Result: NEGATIVE

## 2020-07-07 ENCOUNTER — Ambulatory Visit: Payer: BC Managed Care – PPO | Admitting: Hematology & Oncology

## 2020-07-13 ENCOUNTER — Encounter: Payer: Self-pay | Admitting: Nurse Practitioner

## 2020-07-13 DIAGNOSIS — C9100 Acute lymphoblastic leukemia not having achieved remission: Secondary | ICD-10-CM | POA: Insufficient documentation

## 2020-07-21 ENCOUNTER — Ambulatory Visit: Payer: BC Managed Care – PPO | Admitting: Nurse Practitioner

## 2020-07-27 ENCOUNTER — Telehealth: Payer: Self-pay | Admitting: Hematology & Oncology

## 2020-07-27 NOTE — Telephone Encounter (Signed)
Called patient to schedule follow up appointment, left message.Johnny Robbins, Johnny Ly, RN  P [facility] Ic Appt Pool  Hi,     Please schedule follow up       Clinic Appt: Dr. Danne Harbor or Janalyn Rouse     Preferred day/time: next available, canceled 07/21/20 appt, Dr. Danne Harbor would like pt to follow up     Appt type: in person     Location: [place]     Thank you,     Verdis Frederickson       Dr. Danne Harbor is conducting clinic as follows:     Tuesdays-zoom before 11am, in person after 2pm in [place]   Wednesdays-all day in person [place]   Thursdays-all day in person [place]
